# Patient Record
Sex: Female | Born: 1990 | Race: White | Hispanic: No | Marital: Married | Smoking: Current some day smoker
Health system: Southern US, Community
[De-identification: ages and names within clinical notes are randomized; demographics above are authoritative.]

## PROBLEM LIST (undated history)

## (undated) DIAGNOSIS — K219 Gastro-esophageal reflux disease without esophagitis: Secondary | ICD-10-CM

## (undated) DIAGNOSIS — Q512 Other doubling of uterus, unspecified: Secondary | ICD-10-CM

## (undated) DIAGNOSIS — O09899 Supervision of other high risk pregnancies, unspecified trimester: Principal | ICD-10-CM

## (undated) DIAGNOSIS — T7840XA Allergy, unspecified, initial encounter: Secondary | ICD-10-CM

## (undated) DIAGNOSIS — Q5128 Other doubling of uterus, other specified: Secondary | ICD-10-CM

## (undated) DIAGNOSIS — H9190 Unspecified hearing loss, unspecified ear: Secondary | ICD-10-CM

## (undated) DIAGNOSIS — F419 Anxiety disorder, unspecified: Secondary | ICD-10-CM

## (undated) DIAGNOSIS — F32A Depression, unspecified: Secondary | ICD-10-CM

## (undated) DIAGNOSIS — F329 Major depressive disorder, single episode, unspecified: Secondary | ICD-10-CM

## (undated) DIAGNOSIS — J45909 Unspecified asthma, uncomplicated: Secondary | ICD-10-CM

## (undated) DIAGNOSIS — O34599 Maternal care for other abnormalities of gravid uterus, unspecified trimester: Secondary | ICD-10-CM

## (undated) DIAGNOSIS — O24419 Gestational diabetes mellitus in pregnancy, unspecified control: Secondary | ICD-10-CM

## (undated) HISTORY — DX: Maternal care for other abnormalities of gravid uterus, unspecified trimester: O34.599

## (undated) HISTORY — DX: Anxiety disorder, unspecified: F41.9

## (undated) HISTORY — DX: Depression, unspecified: F32.A

## (undated) HISTORY — DX: Allergy, unspecified, initial encounter: T78.40XA

## (undated) HISTORY — DX: Gestational diabetes mellitus in pregnancy, unspecified control: O24.419

## (undated) HISTORY — DX: Gastro-esophageal reflux disease without esophagitis: K21.9

## (undated) HISTORY — PX: WISDOM TOOTH EXTRACTION: SHX21

## (undated) HISTORY — DX: Unspecified asthma, uncomplicated: J45.909

## (undated) HISTORY — DX: Unspecified hearing loss, unspecified ear: H91.90

## (undated) HISTORY — DX: Major depressive disorder, single episode, unspecified: F32.9

## (undated) HISTORY — DX: Supervision of other high risk pregnancies, unspecified trimester: O09.899

## (undated) HISTORY — DX: Other and unspecified doubling of uterus: Q51.28

## (undated) HISTORY — DX: Other doubling of uterus, unspecified: Q51.20

---
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
## 2009-01-07 IMAGING — US US OB FOLLOW-UP
1 series · 14 of 18 positions shown · non-contrast
Comparison: none

OBSTETRICAL ULTRASOUND:
 This ultrasound exam was performed in the [HOSPITAL] Ultrasound Department.  The OB US report was generated in the AS system, and faxed to the ordering physician.  This report is also available in [REDACTED] PACS.

[Series 1: us ob follow-up · 0.28mm/px · 14 of 18 slices shown]
[im 1/18]
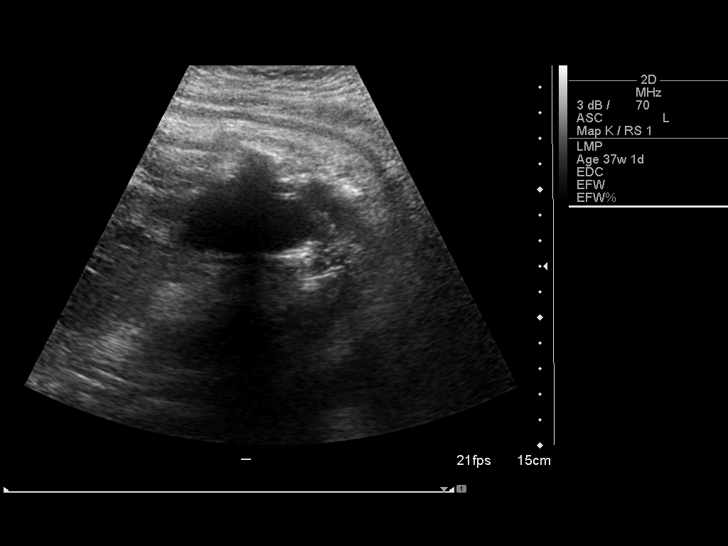
[im 2/18]
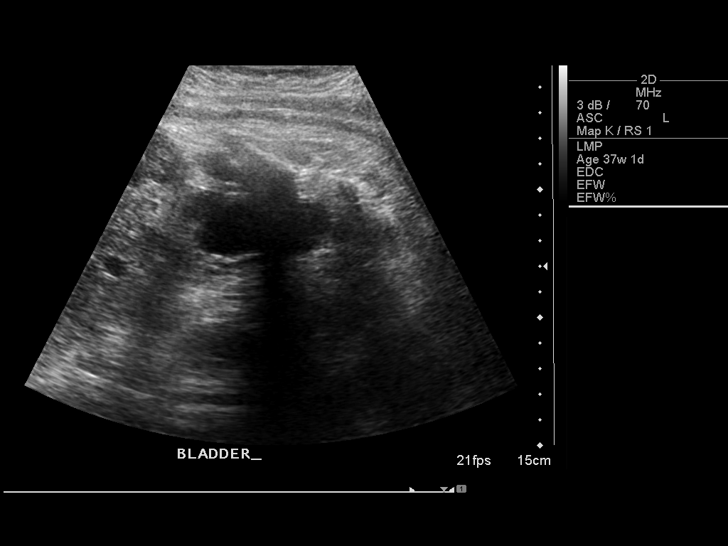
[im 4/18]
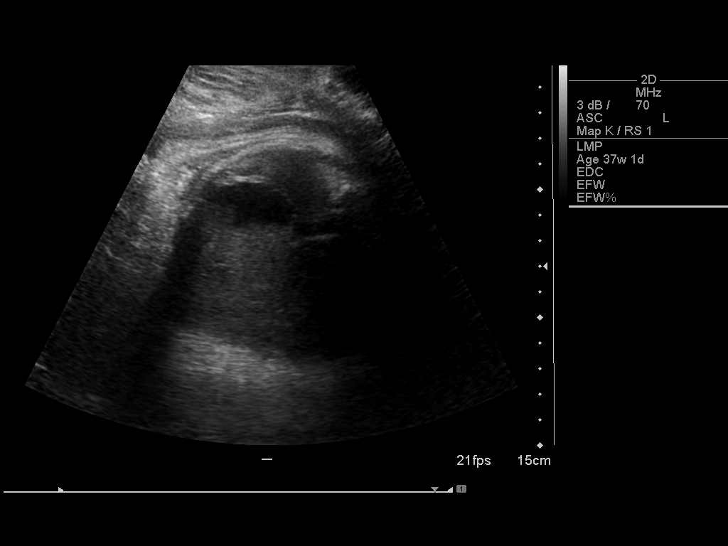
[im 5/18]
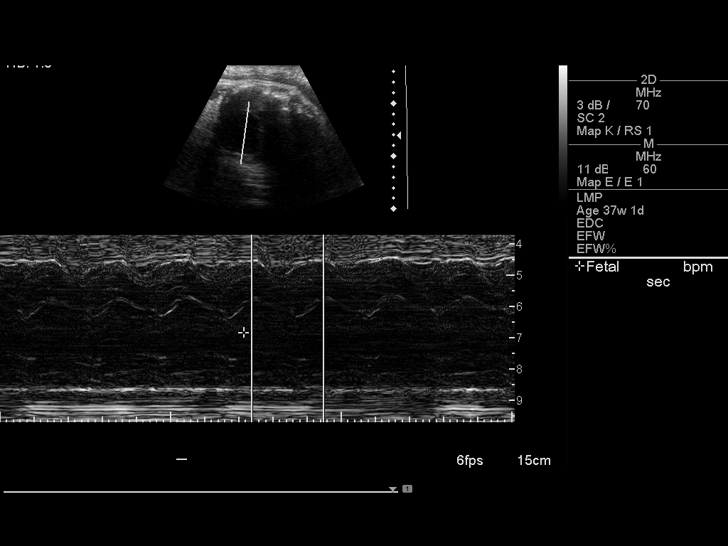
[im 6/18]
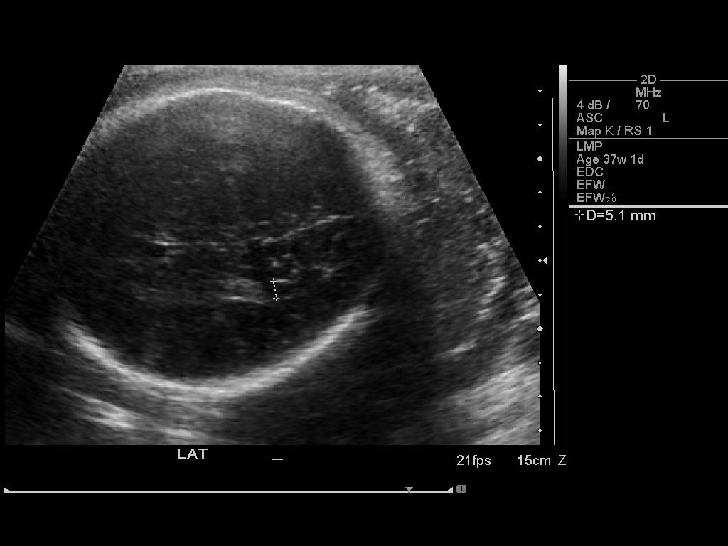
[im 8/18]
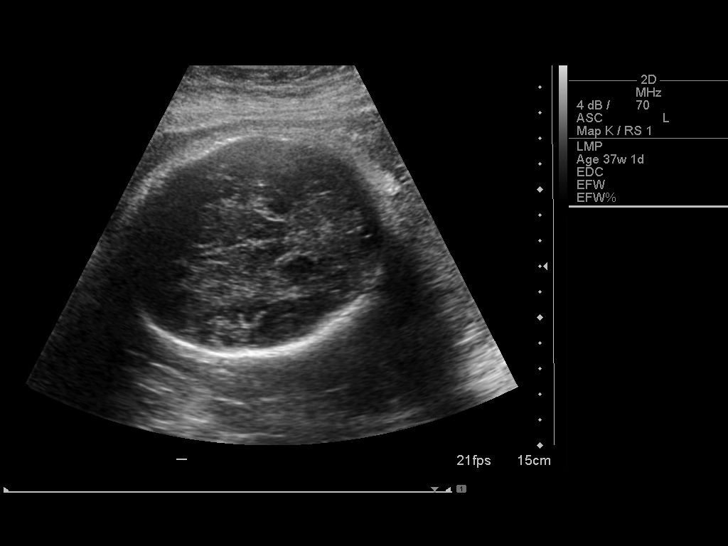
[im 9/18]
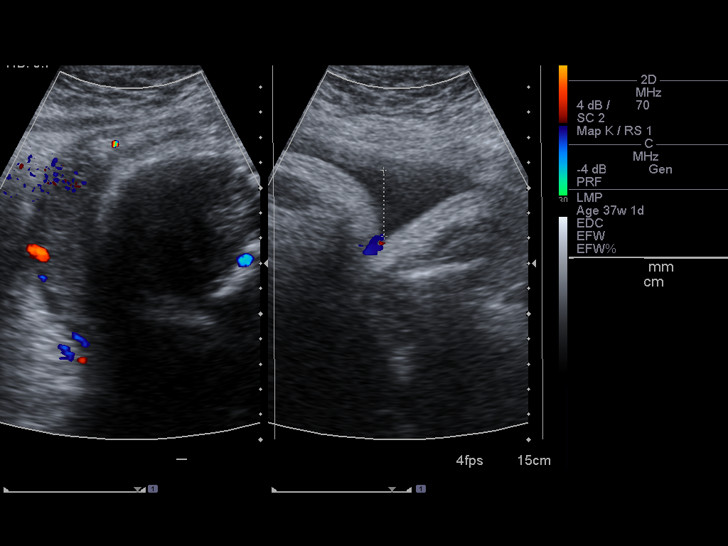
[im 10/18]
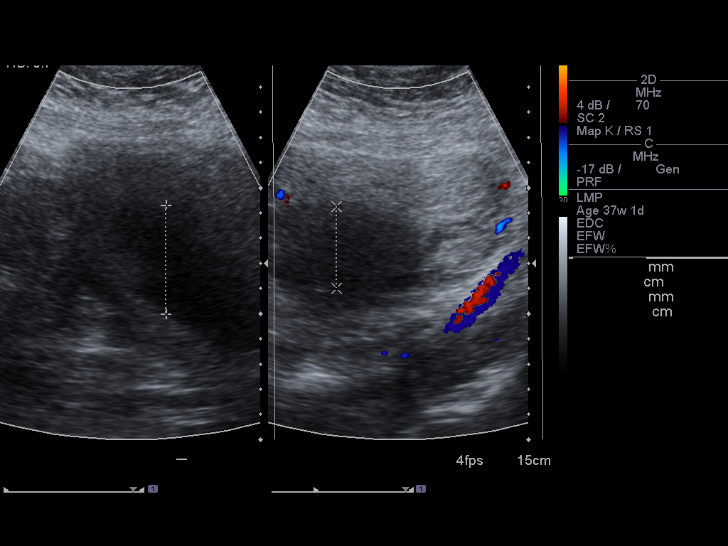
[im 11/18]
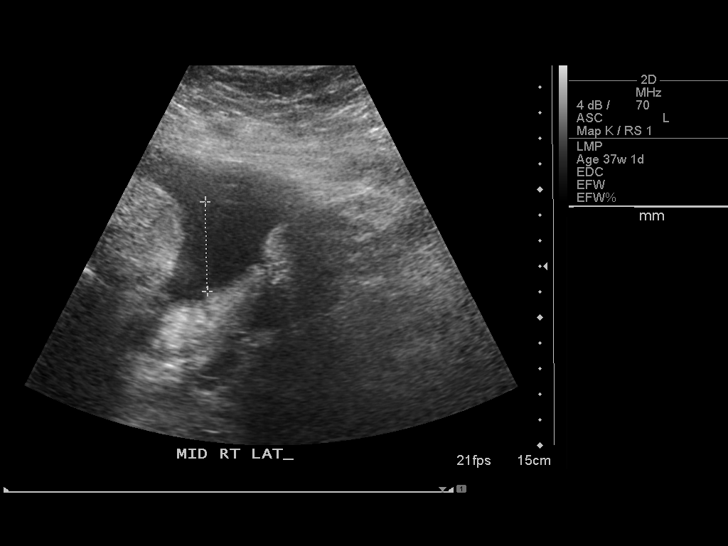
[im 13/18]
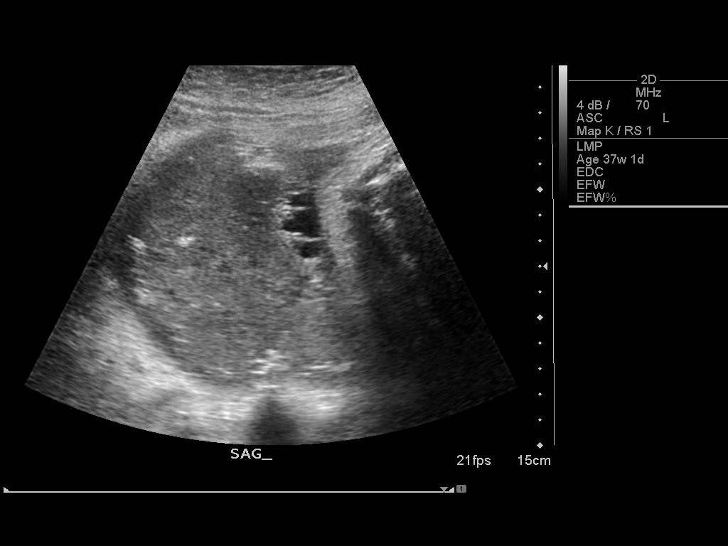
[im 14/18]
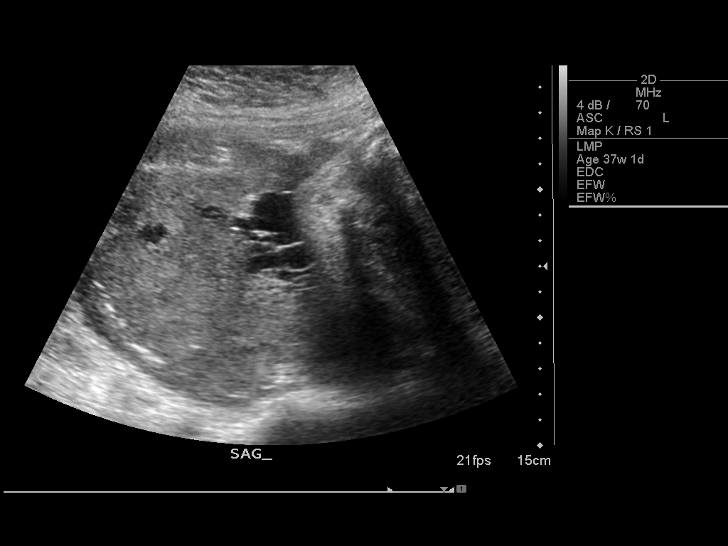
[im 15/18]
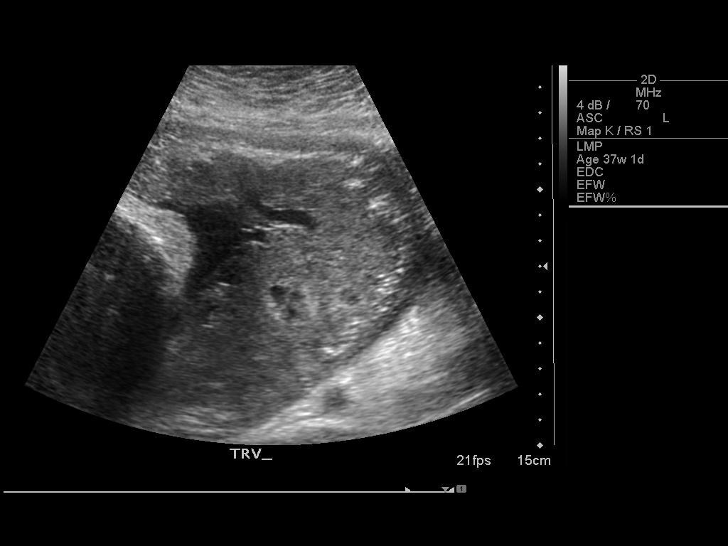
[im 17/18]
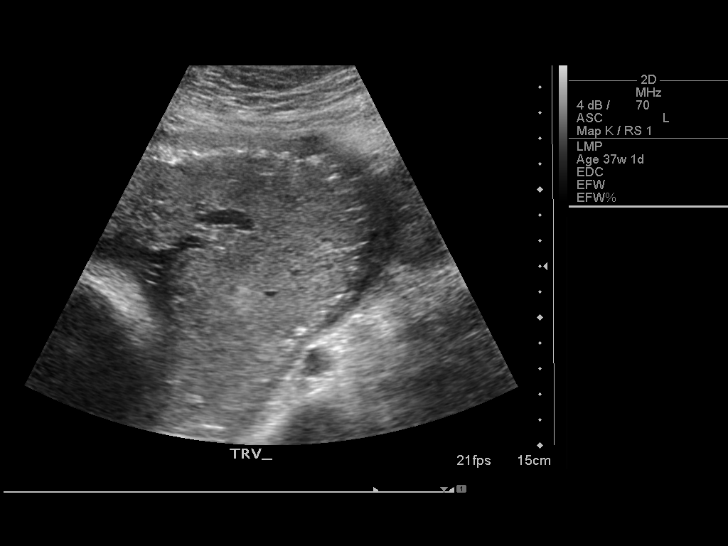
[im 18/18]
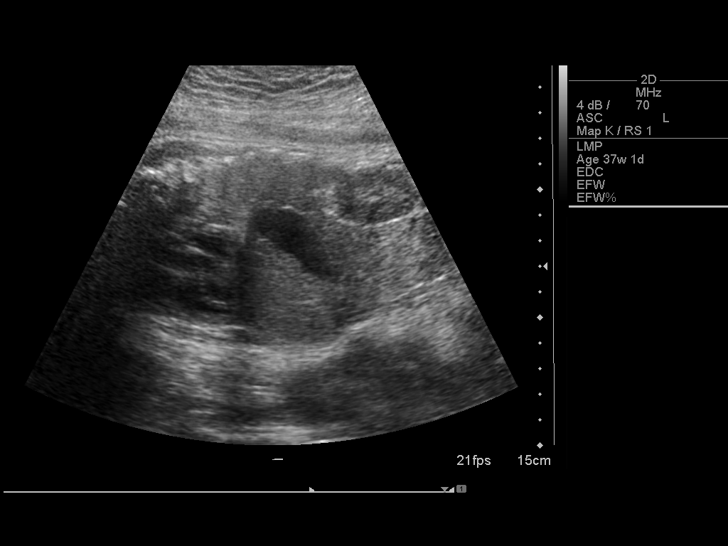

[14 of 18 positions shown; findings below may reference images not displayed]

IMPRESSION: See AS Obstetric US report.

## 2009-01-13 IMAGING — US US OB LIMITED
1 series · 10 of 10 positions shown · non-contrast
Comparison: none

OBSTETRICAL ULTRASOUND:
 This ultrasound exam was performed in the [HOSPITAL] Ultrasound Department.  The OB US report was generated in the AS system, and faxed to the ordering physician.  This report is also available in [REDACTED] PACS.

[Series 1: us ob limited · 0.28mm/px · 10 of 10 slices shown]
[im 1/10]
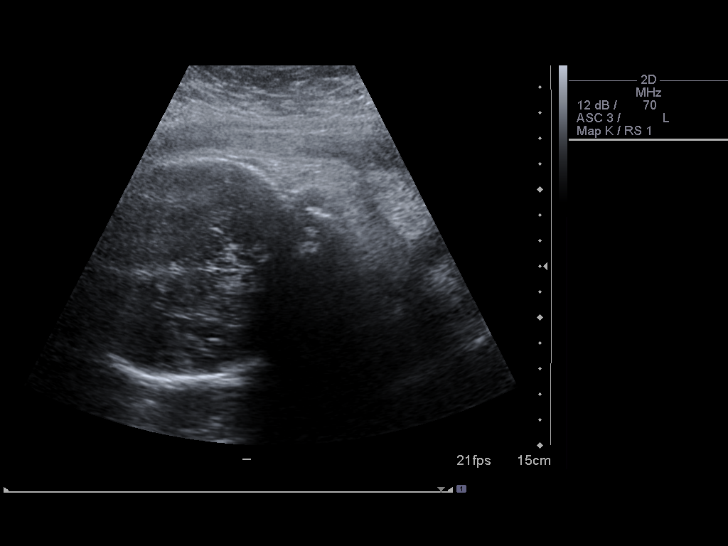
[im 2/10]
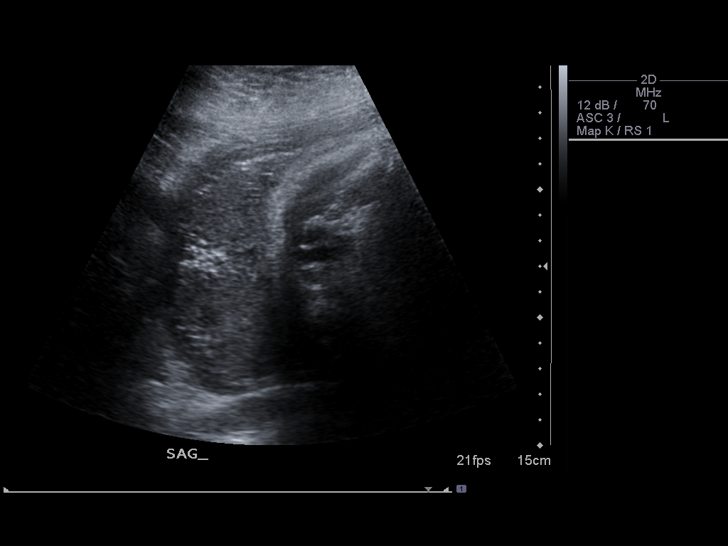
[im 3/10]
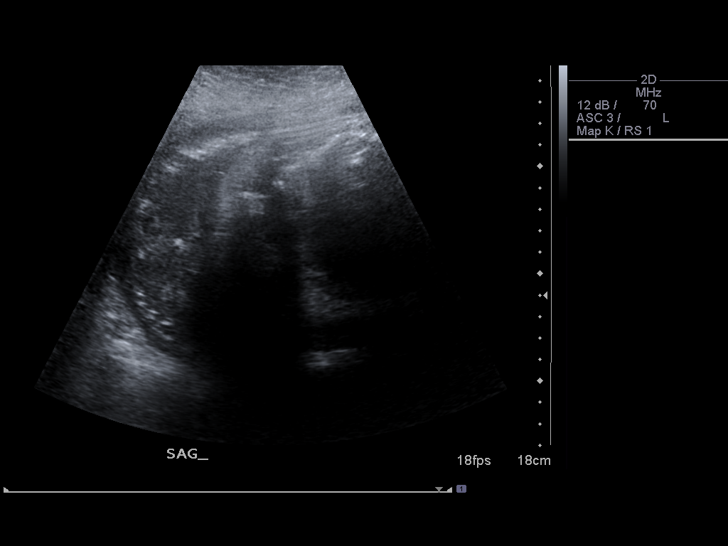
[im 4/10]
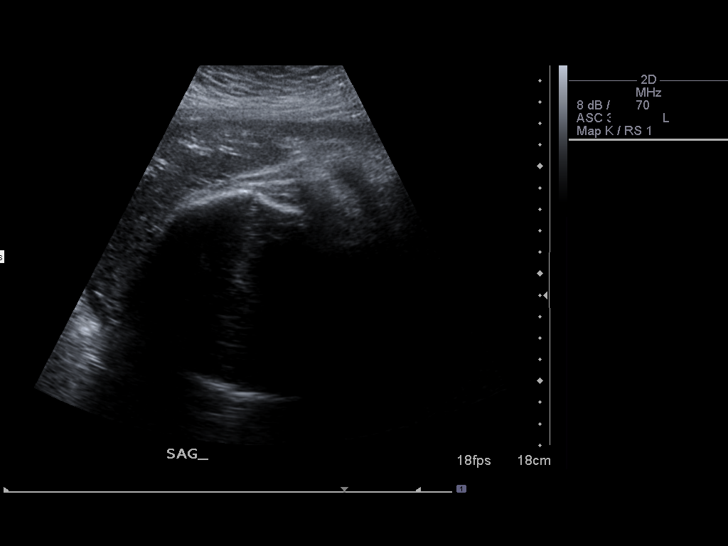
[im 5/10]
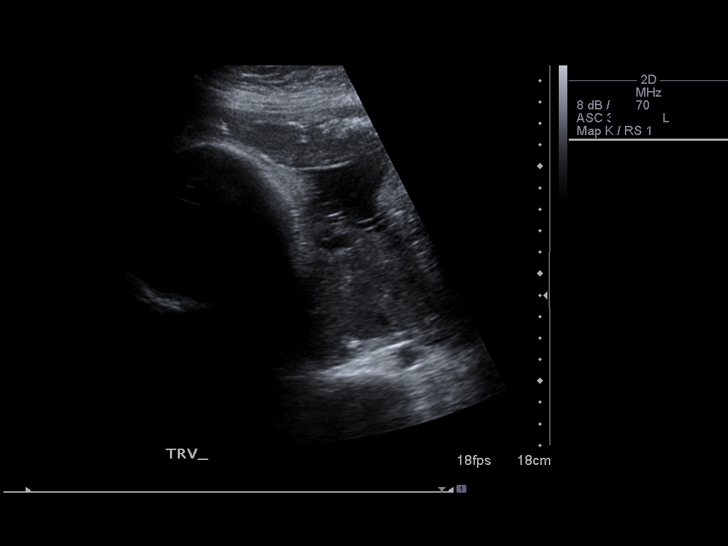
[im 6/10]
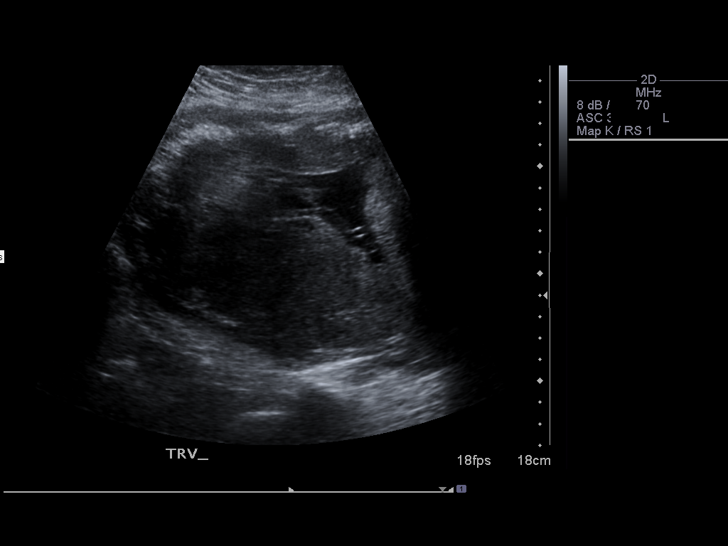
[im 7/10]
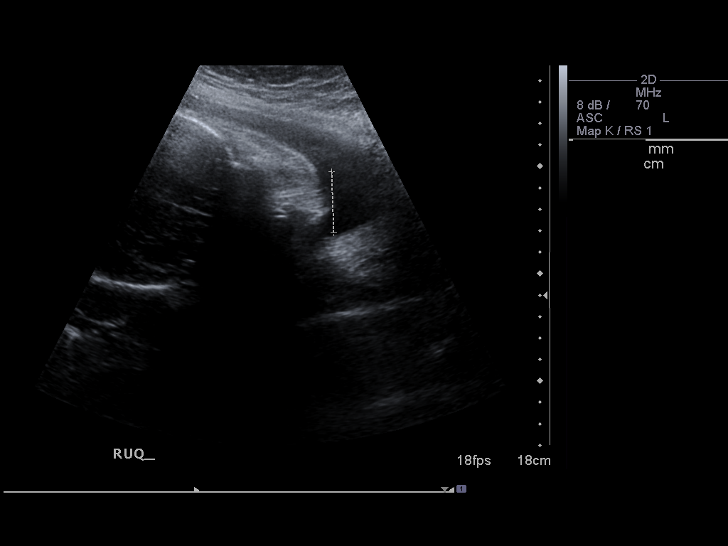
[im 8/10]
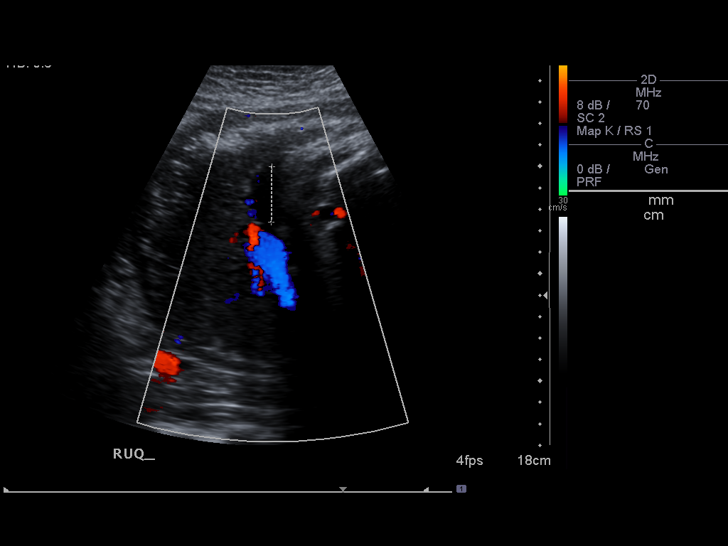
[im 9/10]
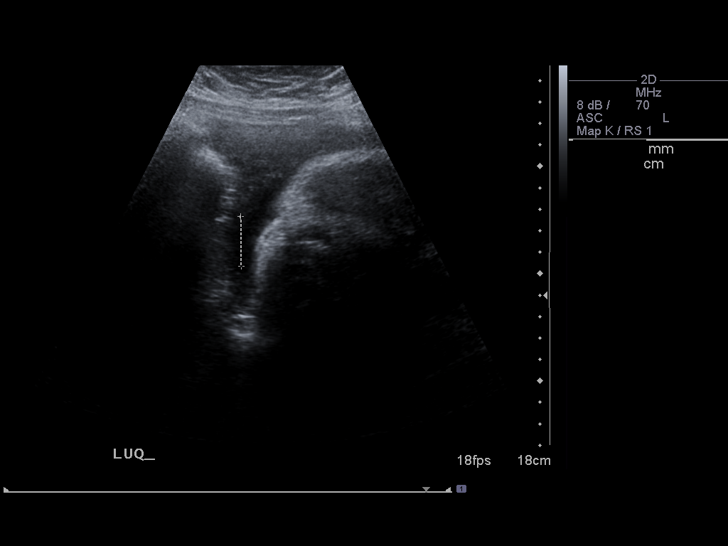
[im 10/10]
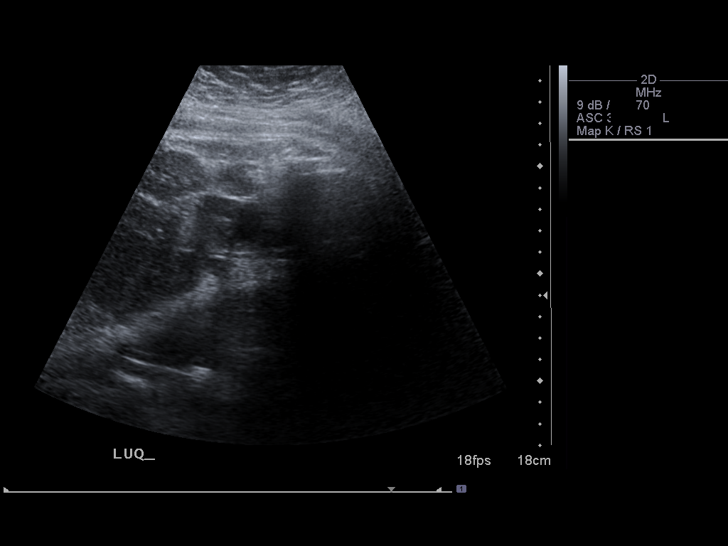

[10 of 10 positions shown; findings below may reference images not displayed]

IMPRESSION: See AS Obstetric US report.

## 2010-08-20 ENCOUNTER — Encounter: Payer: Self-pay | Admitting: *Deleted

## 2010-11-09 LAB — CBC
HCT: 36.7 % (ref 36.0–46.0)
Hemoglobin: 12.6 g/dL (ref 12.0–15.0)
MCHC: 34.3 g/dL (ref 30.0–36.0)
MCV: 80.8 fL (ref 78.0–100.0)
RDW: 15 % (ref 11.5–15.5)

## 2010-11-09 LAB — POCT I-STAT, CHEM 8
BUN: 11 mg/dL (ref 6–23)
Chloride: 108 mEq/L (ref 96–112)
HCT: 39 % (ref 36.0–46.0)
Hemoglobin: 13.3 g/dL (ref 12.0–15.0)
Potassium: 3.8 mEq/L (ref 3.5–5.1)
TCO2: 24 mmol/L (ref 0–100)

## 2010-12-12 NOTE — Discharge Summary (Signed)
Tracy Berger, Tracy Berger           ACCOUNT NO.:  [id number]   MEDICAL RECORD NO.:  [id number]          PATIENT TYPE:  INP   LOCATION:                                FACILITY:  [facility]   PHYSICIAN:  Phil D. Okey Dupre, M.D.     DATE OF BIRTH:  16-Aug-1990   DATE OF ADMISSION:  12/05/2007  DATE OF DISCHARGE:  12/08/2007                               DISCHARGE SUMMARY   REASON FOR ADMISSION:  Pregnancy at term.   PREOPERATIVE DIAGNOSES:  1. Breech presentation.  2. Term pregnancy.  3. Oligohydramnios.  4. Gestational diabetes.   PROCEDURE:  Low transverse cesarean section.   HOSPITAL COURSE:  Hospital course has been uneventful.  The patient has  progressed to a regular diet without complications.  Her vital signs  were stable.   PHYSICAL EXAMINATION:  VITAL SIGNS:  Stable.  HEART:  Regular rhythm and rate.  LUNGS:  Clear to auscultation bilaterally.  ABDOMEN:  Soft and nontender.  Bowel sounds x4.  Incision is intact.  There is no redness, swelling, or drainage.  Staples are intact.  Fundus  is firm.  Lochia is small amount.  EXTREMITIES:  There is no edema in the lower extremities.   LABORATORY DATA:  Hemoglobin on Dec 07, 2007, was 8.6 and hematocrit  25.0.  The patient is tolerating the anemic state well.  She is up and  ambulating without complications.   ASSESSMENT:  1. Stable postoperative day 3.  2. Anemia secondary to blood loss.   PLAN:  Discharged her home.   DISCHARGE MEDICATIONS:  1. Repliva 1 p.o. daily.  2. Motrin 600 1 p.o. q.6 h. p.r.n. cramps.  3. Lortab 5/325 mg 1 p.o. q.4 h. p.r.n. pain.   We are going to discharge her home.  I would have nurses follow up on  day 5 through 7 for removal of staples.      Zerita Boers, N.M.      Phil D. Okey Dupre, M.D.  Electronically Signed    DL/MEDQ  D:  04/54/0981  T:  12/08/2007  Job:  [id number]

## 2010-12-12 NOTE — Op Note (Signed)
NAMESHEYLIN, Tracy Berger           ACCOUNT NO.:  [id number]   MEDICAL RECORD NO.:  [id number]          PATIENT TYPE:  INP   LOCATION:                                FACILITY:  [facility]   PHYSICIAN:  Phil D. Okey Dupre, M.D.     DATE OF BIRTH:  1991/03/09   DATE OF PROCEDURE:  12/05/2007  DATE OF DISCHARGE:                               OPERATIVE REPORT   PROCEDURES:  Low-transverse cesarean section.   PREOPERATIVE DIAGNOSES:  Term pregnancy breech presentation, a  primigravida.  The patient with oligohydramnios and gestational  diabetes.   SURGEON:  Javier Glazier. Okey Dupre, MD   ANESTHESIA:  Spinal.   POSTOPERATIVE DIAGNOSES:  Term pregnancy breech presentation, a  primigravida.  The patient with oligohydramnios and gestational  diabetes.   PROCEDURE:  Low-transverse cesarean section.   ESTIMATED BLOOD LOSS:  700 mL.   SPECIMEN TO PATHOLOGY:  Placenta.   Infant female   PROCEDURE:  Under satisfactory spinal anesthesia with the patient in  dorsal supine position.  A Foley catheter in the urinary bladder, the  abdomen was prepped and draped in usual sterile manner and entered  through a transverse Pfannenstiel incision situated 3 cm above the  symphysis pubis and extending for a total length of 16 cm.  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  The visceral  peritoneum was opened transversely by sharp dissection and the bladder  pushed away from the lower uterine segment.  The lower uterine segment  was entered by sharp and blunt dissection, with a right single footling  breech presentation, and the baby was delivered by complete breech  extraction.  The uterus explored till the placenta was removed and the  specimens taken for an analysis and uterus was closed with continuous  running locked 0 Vicryl on an atraumatic needle.  Two  figure-of-eights  were placed in mid portion of the incision where there was still some  oozing using the same suture.  The area was observed for bleeding, none  was noted and then the area was irrigated, the ovaries and tubes were  both normal, and the fascia was closed with a continuous running 0  Vicryl on an atraumatic needle.  Subcutaneous bleeders were controlled with a cautery and skin edges were  approximated with skin staple, dry sterile dressings applied, and the  patient was transferred to the recovery room in satisfactory condition  having tolerated the procedure well with a total blood loss about 700  mL.      Phil D. Okey Dupre, M.D.  Electronically Signed     PDR/MEDQ  D:  12/05/2007  T:  12/06/2007  Job:  [id number]

## 2011-04-10 ENCOUNTER — Emergency Department (HOSPITAL_COMMUNITY)
Admission: EM | Admit: 2011-04-10 | Discharge: 2011-04-10 | Disposition: A | Discharge status: Home / Self Care | Payer: Self-pay | Attending: Emergency Medicine | Admitting: Emergency Medicine

## 2011-04-10 DIAGNOSIS — L989 Disorder of the skin and subcutaneous tissue, unspecified: Secondary | ICD-10-CM | POA: Insufficient documentation

## 2011-04-10 DIAGNOSIS — R21 Rash and other nonspecific skin eruption: Secondary | ICD-10-CM | POA: Insufficient documentation

## 2011-04-10 LAB — POCT I-STAT, CHEM 8
HCT: 34 % — ABNORMAL LOW (ref 36.0–46.0)
Hemoglobin: 11.6 g/dL — ABNORMAL LOW (ref 12.0–15.0)

## 2011-04-10 LAB — CBC
Hemoglobin: 11.2 g/dL — ABNORMAL LOW (ref 12.0–15.0)
MCHC: 33.5 g/dL (ref 30.0–36.0)
MCV: 88.8 fL (ref 78.0–100.0)
Platelets: 279 10*3/uL (ref 150–400)
WBC: 9.6 10*3/uL (ref 4.0–10.5)

## 2011-04-10 LAB — DIFFERENTIAL
Basophils Relative: 0 % (ref 0–1)
Eosinophils Absolute: 0.3 10*3/uL (ref 0.0–0.7)
Lymphocytes Relative: 14 % (ref 12–46)
Lymphs Abs: 1.3 10*3/uL (ref 0.7–4.0)
Monocytes Relative: 5 % (ref 3–12)

## 2011-04-19 LAB — POCT URINALYSIS DIP (DEVICE)
Hgb urine dipstick: NEGATIVE
Ketones, ur: NEGATIVE
Ketones, ur: NEGATIVE
Operator id: 297281
Operator id: 148111
Protein, ur: NEGATIVE
Protein, ur: 30 — AB
Specific Gravity, Urine: 1.025
Specific Gravity, Urine: 1.025
Urobilinogen, UA: 0.2
Urobilinogen, UA: 0.2
pH: 6
pH: 5.5

## 2011-04-20 LAB — POCT URINALYSIS DIP (DEVICE)
Hgb urine dipstick: NEGATIVE
Ketones, ur: NEGATIVE
Ketones, ur: NEGATIVE
Operator id: 297281
Operator id: 297281
Protein, ur: NEGATIVE
Protein, ur: NEGATIVE
Specific Gravity, Urine: 1.025
Specific Gravity, Urine: 1.01
Urobilinogen, UA: 0.2

## 2011-04-23 LAB — POCT URINALYSIS DIP (DEVICE)
Bilirubin Urine: NEGATIVE
Glucose, UA: NEGATIVE
Glucose, UA: NEGATIVE
Glucose, UA: NEGATIVE
Hgb urine dipstick: NEGATIVE
Ketones, ur: NEGATIVE
Ketones, ur: NEGATIVE
Ketones, ur: NEGATIVE
Operator id: 15968
Operator id: 159681
Operator id: 297281
Protein, ur: NEGATIVE
Specific Gravity, Urine: 1.02
Specific Gravity, Urine: 1.015
Specific Gravity, Urine: 1.025

## 2011-04-24 LAB — URINALYSIS, ROUTINE W REFLEX MICROSCOPIC
Glucose, UA: NEGATIVE
Protein, ur: NEGATIVE
Urobilinogen, UA: 0.2

## 2011-04-24 LAB — POCT URINALYSIS DIP (DEVICE)
Bilirubin Urine: NEGATIVE
Bilirubin Urine: NEGATIVE
Bilirubin Urine: NEGATIVE
Glucose, UA: NEGATIVE
Glucose, UA: NEGATIVE
Glucose, UA: NEGATIVE
Glucose, UA: NEGATIVE
Ketones, ur: NEGATIVE
Ketones, ur: NEGATIVE
Nitrite: NEGATIVE
Nitrite: NEGATIVE
Operator id: 194561
Operator id: 194561
Operator id: 200901
Operator id: 194561
Operator id: 297281
Protein, ur: 30 — AB
Specific Gravity, Urine: 1.02
Specific Gravity, Urine: 1.025
Specific Gravity, Urine: 1.025
Specific Gravity, Urine: 1.02
Urobilinogen, UA: 0.2
Urobilinogen, UA: 0.2
Urobilinogen, UA: 0.2
Urobilinogen, UA: 0.2
Urobilinogen, UA: 0.2

## 2011-05-04 LAB — POCT URINALYSIS DIP (DEVICE)
Operator id: 297281
Protein, ur: NEGATIVE
Urobilinogen, UA: 0.2

## 2011-05-07 LAB — POCT URINALYSIS DIP (DEVICE)
Operator id: 297281
Protein, ur: NEGATIVE
Specific Gravity, Urine: 1.02
Urobilinogen, UA: 0.2

## 2011-05-08 LAB — POCT URINALYSIS DIP (DEVICE)
Operator id: 159681
Protein, ur: NEGATIVE
Specific Gravity, Urine: >=1.03
Urobilinogen, UA: 0.2
pH: 5.5

## 2013-09-16 ENCOUNTER — Encounter: Payer: Self-pay | Admitting: Gastroenterology

## 2013-12-23 ENCOUNTER — Other Ambulatory Visit: Payer: Self-pay | Admitting: Obstetrics & Gynecology

## 2013-12-23 DIAGNOSIS — O3680X0 Pregnancy with inconclusive fetal viability, not applicable or unspecified: Secondary | ICD-10-CM

## 2013-12-24 ENCOUNTER — Ambulatory Visit: Payer: Medicaid Other

## 2013-12-25 ENCOUNTER — Encounter: Payer: Self-pay | Admitting: Obstetrics & Gynecology

## 2013-12-25 ENCOUNTER — Ambulatory Visit (INDEPENDENT_AMBULATORY_CARE_PROVIDER_SITE_OTHER): Payer: Medicaid Other

## 2013-12-25 ENCOUNTER — Other Ambulatory Visit: Payer: Self-pay | Admitting: Obstetrics & Gynecology

## 2013-12-25 DIAGNOSIS — Q5128 Other doubling of uterus, other specified: Secondary | ICD-10-CM

## 2013-12-25 DIAGNOSIS — O3680X0 Pregnancy with inconclusive fetal viability, not applicable or unspecified: Secondary | ICD-10-CM

## 2013-12-25 DIAGNOSIS — O34599 Maternal care for other abnormalities of gravid uterus, unspecified trimester: Secondary | ICD-10-CM

## 2013-12-25 DIAGNOSIS — O3400 Maternal care for unspecified congenital malformation of uterus, unspecified trimester: Secondary | ICD-10-CM

## 2013-12-25 NOTE — Progress Notes (Signed)
U/S(9+1wks)-single IUP noted in Rt uterus, +FCA noted, FHR-175 bpm, RT cx appears closed, CRL c/w LMP dates, LT uterus appears WNL, bilateral adnexa appears WNL unable to note # of ovaries in pelvis

## 2013-12-28 ENCOUNTER — Other Ambulatory Visit: Payer: Medicaid Other

## 2013-12-29 ENCOUNTER — Other Ambulatory Visit: Payer: Self-pay

## 2013-12-31 ENCOUNTER — Encounter: Payer: Medicaid Other | Admitting: Adult Health

## 2014-01-05 ENCOUNTER — Other Ambulatory Visit (HOSPITAL_COMMUNITY)
Admission: RE | Admit: 2014-01-05 | Discharge: 2014-01-05 | Disposition: A | Discharge status: Home / Self Care | Payer: Medicaid Other | Source: Ambulatory Visit | Attending: Adult Health | Admitting: Adult Health

## 2014-01-05 ENCOUNTER — Other Ambulatory Visit (HOSPITAL_COMMUNITY)
Admission: RE | Admit: 2014-01-05 | Discharge: 2014-01-05 | Disposition: A | Discharge status: Home / Self Care | Payer: Medicaid Other | Source: Ambulatory Visit | Attending: Obstetrics and Gynecology | Admitting: Obstetrics and Gynecology

## 2014-01-05 ENCOUNTER — Ambulatory Visit (INDEPENDENT_AMBULATORY_CARE_PROVIDER_SITE_OTHER): Payer: Medicaid Other | Admitting: Adult Health

## 2014-01-05 ENCOUNTER — Encounter: Payer: Self-pay | Admitting: Adult Health

## 2014-01-05 VITALS — BP 112/60 | Wt 204.0 lb

## 2014-01-05 DIAGNOSIS — Z1389 Encounter for screening for other disorder: Secondary | ICD-10-CM

## 2014-01-05 DIAGNOSIS — Z113 Encounter for screening for infections with a predominantly sexual mode of transmission: Secondary | ICD-10-CM | POA: Insufficient documentation

## 2014-01-05 DIAGNOSIS — Z01419 Encounter for gynecological examination (general) (routine) without abnormal findings: Secondary | ICD-10-CM | POA: Insufficient documentation

## 2014-01-05 DIAGNOSIS — H9190 Unspecified hearing loss, unspecified ear: Secondary | ICD-10-CM

## 2014-01-05 DIAGNOSIS — Z3480 Encounter for supervision of other normal pregnancy, unspecified trimester: Secondary | ICD-10-CM

## 2014-01-05 DIAGNOSIS — O09899 Supervision of other high risk pregnancies, unspecified trimester: Secondary | ICD-10-CM

## 2014-01-05 DIAGNOSIS — O3400 Maternal care for unspecified congenital malformation of uterus, unspecified trimester: Secondary | ICD-10-CM

## 2014-01-05 DIAGNOSIS — Q5128 Other doubling of uterus, other specified: Secondary | ICD-10-CM

## 2014-01-05 DIAGNOSIS — O34599 Maternal care for other abnormalities of gravid uterus, unspecified trimester: Secondary | ICD-10-CM

## 2014-01-05 DIAGNOSIS — Z331 Pregnant state, incidental: Secondary | ICD-10-CM

## 2014-01-05 HISTORY — DX: Unspecified hearing loss, unspecified ear: H91.90

## 2014-01-05 HISTORY — DX: Maternal care for other abnormalities of gravid uterus, unspecified trimester: O34.599

## 2014-01-05 HISTORY — DX: Supervision of other high risk pregnancies, unspecified trimester: O09.899

## 2014-01-05 LAB — POCT URINALYSIS DIPSTICK
Glucose, UA: NEGATIVE
Ketones, UA: NEGATIVE
Leukocytes, UA: NEGATIVE
NITRITE UA: NEGATIVE
Protein, UA: NEGATIVE
RBC UA: NEGATIVE

## 2014-01-05 LAB — CBC
HEMATOCRIT: 30.3 % — AB (ref 36.0–46.0)
Hemoglobin: 10.5 g/dL — ABNORMAL LOW (ref 12.0–15.0)
MCH: 30.1 pg (ref 26.0–34.0)
MCHC: 34.7 g/dL (ref 30.0–36.0)
MCV: 86.8 fL (ref 78.0–100.0)
PLATELETS: 242 10*3/uL (ref 150–400)
RBC: 3.49 MIL/uL — ABNORMAL LOW (ref 3.87–5.11)
RDW: 13.4 % (ref 11.5–15.5)
WBC: 10.3 10*3/uL (ref 4.0–10.5)

## 2014-01-05 MED ORDER — PRENATAL PLUS 27-1 MG PO TABS: 1.0000 | ORAL_TABLET | Freq: Every day | ORAL | Status: DC

## 2014-01-05 NOTE — Patient Instructions (Signed)

## 2014-01-05 NOTE — Progress Notes (Signed)
Pt here today for her New OB visit. Pt given CCNC form and lab consents to read over and sign. Pt states that she has 2 separate cervix and 2 separate uterus. Pt states that her gums are really swollen and bleed if you barely touch them.

## 2014-01-05 NOTE — Progress Notes (Signed)
Subjective:  Tracy Berger is a 23 y.o. G68P1001 Caucasian female at [redacted]w[redacted]d by LMP being seen today for her first obstetrical visit.  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  Pregnancy history fully reviewed.  Patient reports no complaints. Denies vb, cramping, uti s/s, abnormal/malodorous vag d/c, or vulvovaginal itching/irritation.  BP 112/60  Wt 204 lb (92.534 kg)  LMP 10/22/2013  HISTORY: OB History  Gravida Para Term Preterm AB SAB TAB Ectopic Multiple Living  2 1 1       1     # Outcome Date GA Lbr Len/2nd Weight Sex Delivery Anes PTL Lv  2 CUR           1 TRM 12/05/07 [redacted]w[redacted]d  7 lb 10 oz (3.459 kg) M LTCS   Y     Comments: breech, Pt has 2 uterus      Past Medical History  Diagnosis Date  . Gestational diabetes     with 1st pregnancy  . Didelphic uterus   . HOH (hard of hearing) 01/05/2014  . Supervision of other high-risk pregnancy(V23.89) 01/05/2014  . 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 Cesarean section    . Wisdom tooth extraction     Family History  Problem Relation Age of Onset  . Cancer Mother 47    colon  . Hearing loss Mother   . Hypertension Father   . Diabetes Father   . Birth defects Maternal Uncle     hole in his heart at birth  . Hearing loss Maternal Uncle   . Diabetes Paternal Aunt   . Diabetes Paternal Uncle   . Heart disease Maternal Grandmother   . Hearing loss Maternal Grandmother   . Diabetes Maternal Grandfather   . Diabetes Paternal Grandmother     Exam   System:     General: Well developed & nourished, no acute distress   Skin: Warm & dry, normal coloration and turgor, no rashes   Neurologic: Alert & oriented, normal mood   Cardiovascular: Regular rate & rhythm   Respiratory: Effort & rate normal, LCTAB, acyanotic   Abdomen: Soft, non tender   Extremities: normal strength, tone                                                           Normal thyroid, Pelvic Exam:    Perineum: Normal perineum   Vulva: Normal, no lesions   Vagina:  Normal mucosa, normal discharge, has septum   Cervix: Normal, , appears closed, has 2   Uterus: Normal size/shape/contour for GA on right has didelphys uterus   Thin prep pap smear done x 2 FHR:166 via doppler   Assessment:   Pregnancy: G2P1001 Patient Active Problem List   Diagnosis Date Noted  . HOH (hard of hearing) 01/05/2014  . Supervision of other high-risk pregnancy(V23.89) 01/05/2014  . 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 GERD 11/24/2008  . GI BLEEDING 11/24/2008    [redacted]w[redacted]d G2P1001 New OB visit     Plan:  Initial labs drawn Continue prenatal vitamins Problem list reviewed and updated Reviewed n/v relief measures and warning s/s to report  Reviewed recommended weight gain based on pre-gravid BMI Encouraged well-balanced diet Genetic Screening discussed Integrated Screen: requested Cystic fibrosis screening discussed requested Ultrasound discussed; fetal survey: requested Follow up in 2 weeks for IT/NT and see Dr Elonda Husky  Estill Dooms, NP 01/05/2014 3:33 PM

## 2014-01-06 LAB — DRUG SCREEN, URINE, NO CONFIRMATION
Amphetamine Screen, Ur: NEGATIVE
BARBITURATE QUANT UR: NEGATIVE
Benzodiazepines.: NEGATIVE
CREATININE, U: 142.4 mg/dL
Cocaine Metabolites: NEGATIVE
METHADONE: NEGATIVE
Marijuana Metabolite: NEGATIVE
Opiate Screen, Urine: NEGATIVE
PROPOXYPHENE: NEGATIVE
Phencyclidine (PCP): NEGATIVE

## 2014-01-06 LAB — URINALYSIS
BILIRUBIN URINE: NEGATIVE
GLUCOSE, UA: NEGATIVE mg/dL
HGB URINE DIPSTICK: NEGATIVE
Leukocytes, UA: NEGATIVE
Nitrite: NEGATIVE
PROTEIN: NEGATIVE mg/dL
Specific Gravity, Urine: 1.022 (ref 1.005–1.030)
Urobilinogen, UA: 0.2 mg/dL (ref 0.0–1.0)
pH: 6 (ref 5.0–8.0)

## 2014-01-06 LAB — HEPATITIS B SURFACE ANTIGEN: HEP B S AG: NEGATIVE

## 2014-01-06 LAB — TSH: TSH: 0.359 u[IU]/mL (ref 0.350–4.500)

## 2014-01-06 LAB — RUBELLA SCREEN: RUBELLA: 3.92 {index} — AB (ref <0.90)

## 2014-01-06 LAB — VARICELLA ZOSTER ANTIBODY, IGG: Varicella IgG: 352.1 Index — ABNORMAL HIGH (ref <135.00)

## 2014-01-06 LAB — HIV ANTIBODY (ROUTINE TESTING W REFLEX): HIV: NONREACTIVE

## 2014-01-06 LAB — RPR

## 2014-01-06 LAB — OXYCODONE SCREEN, UA, RFLX CONFIRM: OXYCODONE SCRN UR: NEGATIVE ng/mL

## 2014-01-07 LAB — URINE CULTURE: Colony Count: 30000

## 2014-01-07 LAB — CYSTIC FIBROSIS DIAGNOSTIC STUDY

## 2014-01-07 LAB — ABO AND RH: Rh Type: POSITIVE

## 2014-01-07 LAB — CYTOLOGY - PAP

## 2014-01-07 LAB — ANTIBODY SCREEN: ANTIBODY SCREEN: NEGATIVE

## 2014-01-08 ENCOUNTER — Encounter: Payer: Self-pay | Admitting: Adult Health

## 2014-01-08 LAB — CYTOLOGY - PAP

## 2014-01-13 ENCOUNTER — Other Ambulatory Visit: Payer: Self-pay | Admitting: Adult Health

## 2014-01-13 DIAGNOSIS — Z36 Encounter for antenatal screening of mother: Secondary | ICD-10-CM

## 2014-01-19 ENCOUNTER — Other Ambulatory Visit: Payer: Medicaid Other

## 2014-01-19 ENCOUNTER — Encounter: Payer: Medicaid Other | Admitting: Obstetrics & Gynecology

## 2014-01-20 ENCOUNTER — Encounter: Payer: Self-pay | Admitting: Obstetrics and Gynecology

## 2014-01-20 ENCOUNTER — Ambulatory Visit (INDEPENDENT_AMBULATORY_CARE_PROVIDER_SITE_OTHER): Payer: Medicaid Other

## 2014-01-20 ENCOUNTER — Ambulatory Visit (INDEPENDENT_AMBULATORY_CARE_PROVIDER_SITE_OTHER): Payer: Medicaid Other | Admitting: Obstetrics and Gynecology

## 2014-01-20 VITALS — BP 120/72 | Wt 204.0 lb

## 2014-01-20 DIAGNOSIS — O34219 Maternal care for unspecified type scar from previous cesarean delivery: Secondary | ICD-10-CM

## 2014-01-20 DIAGNOSIS — O09899 Supervision of other high risk pregnancies, unspecified trimester: Secondary | ICD-10-CM

## 2014-01-20 DIAGNOSIS — Q5128 Other doubling of uterus, other specified: Secondary | ICD-10-CM

## 2014-01-20 DIAGNOSIS — Z331 Pregnant state, incidental: Secondary | ICD-10-CM

## 2014-01-20 DIAGNOSIS — Z3481 Encounter for supervision of other normal pregnancy, first trimester: Secondary | ICD-10-CM

## 2014-01-20 DIAGNOSIS — Z1389 Encounter for screening for other disorder: Secondary | ICD-10-CM

## 2014-01-20 DIAGNOSIS — Z36 Encounter for antenatal screening of mother: Secondary | ICD-10-CM

## 2014-01-20 DIAGNOSIS — Z3480 Encounter for supervision of other normal pregnancy, unspecified trimester: Secondary | ICD-10-CM

## 2014-01-20 DIAGNOSIS — O34591 Maternal care for other abnormalities of gravid uterus, first trimester: Secondary | ICD-10-CM

## 2014-01-20 DIAGNOSIS — O3400 Maternal care for unspecified congenital malformation of uterus, unspecified trimester: Secondary | ICD-10-CM

## 2014-01-20 LAB — POCT URINALYSIS DIPSTICK
Blood, UA: NEGATIVE
Glucose, UA: NEGATIVE
KETONES UA: NEGATIVE
LEUKOCYTES UA: NEGATIVE
Nitrite, UA: NEGATIVE
Protein, UA: NEGATIVE

## 2014-01-20 NOTE — Progress Notes (Signed)
U/S(12+6wks)-single active fetus in Rt uterus, fundal Gr 0 placenta, cx appears closed (3.6cm), bilateral adnexa appears WNL, NB present, NT-1.62mm,FHR-158 bpm, Lt uterus noted,

## 2014-01-20 NOTE — Progress Notes (Signed)
Pt denies any problems or concerns at this time.  

## 2014-01-20 NOTE — Progress Notes (Signed)
High Risk Pregnancy Diagnosis(es):   Didelphic Uterus   G2P1001 [redacted]w[redacted]d Estimated Date of Delivery: 07/29/14  Blood pressure 120/72, weight 204 lb (92.534 kg), last menstrual period 10/22/2013.  Urinalysis: Negative  HPI: Patient presents for routine prenatal visit. She has a history of 1 prior cesarean section delivered at [facility]. Desires Repeat Cesarean.  BP weight and urine results all reviewed and noted. Patient  denies any bleeding and no rupture of membranes symptoms or regular contractions.  Fundal Height:  n/a Fetal Heart rate:  156 Edema:  minimal  Patient is without complaints. All questions were answered.  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

## 2014-01-26 LAB — MATERNAL SCREEN, INTEGRATED #1

## 2014-02-01 ENCOUNTER — Encounter: Payer: Medicaid Other | Admitting: Obstetrics & Gynecology

## 2014-02-01 ENCOUNTER — Other Ambulatory Visit: Payer: Medicaid Other

## 2014-02-17 ENCOUNTER — Ambulatory Visit (INDEPENDENT_AMBULATORY_CARE_PROVIDER_SITE_OTHER): Payer: Medicaid Other | Admitting: Advanced Practice Midwife

## 2014-02-17 ENCOUNTER — Encounter: Payer: Self-pay | Admitting: Advanced Practice Midwife

## 2014-02-17 ENCOUNTER — Other Ambulatory Visit: Payer: Medicaid Other

## 2014-02-17 VITALS — BP 120/58 | Wt 211.0 lb

## 2014-02-17 DIAGNOSIS — O09899 Supervision of other high risk pregnancies, unspecified trimester: Secondary | ICD-10-CM

## 2014-02-17 DIAGNOSIS — Q5128 Other doubling of uterus, other specified: Secondary | ICD-10-CM

## 2014-02-17 DIAGNOSIS — O34592 Maternal care for other abnormalities of gravid uterus, second trimester: Secondary | ICD-10-CM

## 2014-02-17 DIAGNOSIS — O3400 Maternal care for unspecified congenital malformation of uterus, unspecified trimester: Secondary | ICD-10-CM

## 2014-02-17 DIAGNOSIS — Z331 Pregnant state, incidental: Secondary | ICD-10-CM

## 2014-02-17 DIAGNOSIS — Z1389 Encounter for screening for other disorder: Secondary | ICD-10-CM

## 2014-02-17 LAB — POCT URINALYSIS DIPSTICK
Blood, UA: NEGATIVE
GLUCOSE UA: NEGATIVE
Ketones, UA: NEGATIVE
NITRITE UA: NEGATIVE
Protein, UA: NEGATIVE

## 2014-02-17 NOTE — Progress Notes (Signed)
High Risk Pregnancy Diagnosis(es):   Didelphys uterus  G2P1001 [redacted]w[redacted]d Estimated Date of Delivery: 07/29/14  Last menstrual period 10/22/2013.  Urinalysis: Negative   HPI:  BP weight and urine results all reviewed and noted. Patient reports good fetal movement, denies any bleeding and no rupture of membranes symptoms or regular contractions.  Fundal Height:  16 Fetal Heart rate:  152 Edema:  none  Patient is without complaints except feet hurting d/t varicose veins. Relief measures discussed. All questions were answered.  Assessment:  [redacted]w[redacted]d,   Didelphys uterus  Medication(s) Plans:  none  Treatment Plan: 2nd IT today;  cx length with anatomy scan  Follow up in 3 weeks for OB appt, anatomy scan, OB appt

## 2014-02-24 LAB — MATERNAL SCREEN, INTEGRATED #2
AFP MoM: 0.85
AFP, Serum: 25.5 ng/mL
Age risk Down Syndrome: 1:1100 {titer}
CROWN RUMP LENGTH MAT SCREEN 2: 69.1 mm
Calculated Gestational Age: 17
ESTRIOL FREE MAT SCREEN: 0.96 ng/mL
ESTRIOL MOM MAT SCREEN: 1.02
HCG, MOM MAT SCREEN: 0.52
HCG, SERUM MAT SCREEN: 12.4 [IU]/mL
INHIBIN A DIMERIC MAT SCREEN: 136 pg/mL
Inhibin A MoM: 0.96
MSS Down Syndrome: <1:5000 {titer}
MSS Trisomy 18 Risk: <1:5000 {titer}
NT MoM: 1.09
Nuchal Translucency: 1.68 mm
Number of fetuses: 1
PAPP-A MoM: 1.87
PAPP-A: 946 ng/mL

## 2014-03-10 ENCOUNTER — Ambulatory Visit (INDEPENDENT_AMBULATORY_CARE_PROVIDER_SITE_OTHER): Payer: Medicaid Other | Admitting: Women's Health

## 2014-03-10 ENCOUNTER — Ambulatory Visit (INDEPENDENT_AMBULATORY_CARE_PROVIDER_SITE_OTHER): Payer: Medicaid Other

## 2014-03-10 ENCOUNTER — Encounter: Payer: Self-pay | Admitting: Women's Health

## 2014-03-10 ENCOUNTER — Other Ambulatory Visit: Payer: Self-pay | Admitting: Advanced Practice Midwife

## 2014-03-10 VITALS — BP 132/56 | Wt 212.0 lb

## 2014-03-10 DIAGNOSIS — O3400 Maternal care for unspecified congenital malformation of uterus, unspecified trimester: Secondary | ICD-10-CM

## 2014-03-10 DIAGNOSIS — Z1389 Encounter for screening for other disorder: Secondary | ICD-10-CM

## 2014-03-10 DIAGNOSIS — O34219 Maternal care for unspecified type scar from previous cesarean delivery: Secondary | ICD-10-CM

## 2014-03-10 DIAGNOSIS — O09899 Supervision of other high risk pregnancies, unspecified trimester: Secondary | ICD-10-CM

## 2014-03-10 DIAGNOSIS — Z331 Pregnant state, incidental: Secondary | ICD-10-CM

## 2014-03-10 DIAGNOSIS — Q5128 Other doubling of uterus, other specified: Secondary | ICD-10-CM

## 2014-03-10 DIAGNOSIS — O34592 Maternal care for other abnormalities of gravid uterus, second trimester: Secondary | ICD-10-CM

## 2014-03-10 DIAGNOSIS — Q512 Other doubling of uterus, unspecified: Secondary | ICD-10-CM

## 2014-03-10 LAB — POCT URINALYSIS DIPSTICK
GLUCOSE UA: NEGATIVE
Ketones, UA: NEGATIVE
Leukocytes, UA: NEGATIVE
NITRITE UA: NEGATIVE
RBC UA: NEGATIVE

## 2014-03-10 NOTE — Progress Notes (Signed)
U/S(19+6wks)-IUP within Rt uterus, active fetus, FHR-153 bpm, meas c/w dates, fluid WNL, posterior Gr 0 placenta, RT cx appears closed (3.5cm), bilateral adnexa appears WNL,no major abnl noted, Lt uterus appears WNL, Female fetus

## 2014-03-10 NOTE — Progress Notes (Signed)
High Risk Pregnancy Diagnosis(es): uterine didelphys G2P1001 [redacted]w[redacted]d Estimated Date of Delivery: 07/29/14 BP 132/56  Wt 212 lb (96.163 kg)  LMP 10/22/2013  Urinalysis: Positive for tr protein HPI:  Rt hip/leg pain- sounds like sciatica, can try exercises and chiropractor if needed BP, weight, and urine reviewed.  Reports good fm. Denies regular uc's, lof, vb, uti s/s. No complaints.  Fundal Height:  @ U Fetal Heart rate:  153 u/s Edema:  none  Reviewed today's normal anatomy u/s, ptl s/s, fm All questions were answered Assessment: [redacted]w[redacted]d uterine didelphys Medication(s) Plans:  n/a Treatment Plan:  Continue care Follow up in 4wks for high-risk OB appt

## 2014-03-10 NOTE — Patient Instructions (Signed)
[place] Pediatricians:  [facility] [phone]            [facility] [phone]                 [facility] [phone] (usually doesn't accept new patients unless you have family there already, you are always welcome to call and ask)             [facility] ([address] [place]) [phone]   [place] Pediatricians:   [facility]: [phone]  [facility]: [phone]   Second Trimester of Pregnancy The second trimester is from week 13 through week 28, months 4 through 6. The second trimester is often a time when you feel your best. Your body has also adjusted to being pregnant, and you begin to feel better physically. Usually, morning sickness has lessened or quit completely, you may have more energy, and you may have an increase in appetite. The second trimester is also a time when the fetus is growing rapidly. At the end of the sixth month, the fetus is about 9 inches long and weighs about 1 pounds. You will likely begin to feel the baby move (quickening) between 18 and 20 weeks of the pregnancy. BODY CHANGES Your body goes through many changes during pregnancy. The changes vary from woman to woman.   Your weight will continue to increase. You will notice your lower abdomen bulging out.  You may begin to get stretch marks on your hips, abdomen, and breasts.  You may develop headaches that can be relieved by medicines approved by your health care provider.  You may urinate more often because the fetus is pressing on your bladder.  You may develop or continue to have heartburn as a result of your pregnancy.  You may develop constipation because certain hormones are causing the muscles that push waste through your intestines to slow down.  You may develop hemorrhoids or swollen, bulging veins (varicose veins).  You may have back pain because of the weight gain and  pregnancy hormones relaxing your joints between the bones in your pelvis and as a result of a shift in weight and the muscles that support your balance.  Your breasts will continue to grow and be tender.  Your gums may bleed and may be sensitive to brushing and flossing.  Dark spots or blotches (chloasma, mask of pregnancy) may develop on your face. This will likely fade after the baby is born.  A dark line from your belly button to the pubic area (linea nigra) may appear. This will likely fade after the baby is born.  You may have changes in your hair. These can include thickening of your hair, rapid growth, and changes in texture. Some women also have hair loss during or after pregnancy, or hair that feels dry or thin. Your hair will most likely return to normal after your baby is born. WHAT TO EXPECT AT YOUR PRENATAL VISITS During a routine prenatal visit:  You will be weighed to make sure you and the fetus are growing normally.  Your blood pressure will be taken.  Your abdomen will be measured to track your baby's growth.  The fetal heartbeat will be listened to.  Any test results from the previous visit will be discussed. Your health care provider may ask you:  How you are feeling.  If you are feeling the baby move.  If you have had any abnormal symptoms, such as leaking fluid,   bleeding, severe headaches, or abdominal cramping.  If you have any questions. Other tests that may be performed during your second trimester include:  Blood tests that check for:  Low iron levels (anemia).  Gestational diabetes (between 24 and 28 weeks).  Rh antibodies.  Urine tests to check for infections, diabetes, or protein in the urine.  An ultrasound to confirm the proper growth and development of the baby.  An amniocentesis to check for possible genetic problems.  Fetal screens for spina bifida and Down syndrome. HOME CARE INSTRUCTIONS   Avoid all smoking, herbs, alcohol, and  unprescribed drugs. These chemicals affect the formation and growth of the baby.  Follow your health care provider's instructions regarding medicine use. There are medicines that are either safe or unsafe to take during pregnancy.  Exercise only as directed by your health care provider. Experiencing uterine cramps is a good sign to stop exercising.  Continue to eat regular, healthy meals.  Wear a good support bra for breast tenderness.  Do not use hot tubs, steam rooms, or saunas.  Wear your seat belt at all times when driving.  Avoid raw meat, uncooked cheese, cat litter boxes, and soil used by cats. These carry germs that can cause birth defects in the baby.  Take your prenatal vitamins.  Try taking a stool softener (if your health care provider approves) if you develop constipation. Eat more high-fiber foods, such as fresh vegetables or fruit and whole grains. Drink plenty of fluids to keep your urine clear or pale yellow.  Take warm sitz baths to soothe any pain or discomfort caused by hemorrhoids. Use hemorrhoid cream if your health care provider approves.  If you develop varicose veins, wear support hose. Elevate your feet for 15 minutes, 3-4 times a day. Limit salt in your diet.  Avoid heavy lifting, wear low heel shoes, and practice good posture.  Rest with your legs elevated if you have leg cramps or low back pain.  Visit your dentist if you have not gone yet during your pregnancy. Use a soft toothbrush to brush your teeth and be gentle when you floss.  A sexual relationship may be continued unless your health care provider directs you otherwise.  Continue to go to all your prenatal visits as directed by your health care provider. SEEK MEDICAL CARE IF:   You have dizziness.  You have mild pelvic cramps, pelvic pressure, or nagging pain in the abdominal area.  You have persistent nausea, vomiting, or diarrhea.  You have a bad smelling vaginal discharge.  You have  pain with urination. SEEK IMMEDIATE MEDICAL CARE IF:   You have a fever.  You are leaking fluid from your vagina.  You have spotting or bleeding from your vagina.  You have severe abdominal cramping or pain.  You have rapid weight gain or loss.  You have shortness of breath with chest pain.  You notice sudden or extreme swelling of your face, hands, ankles, feet, or legs.  You have not felt your baby move in over an hour.  You have severe headaches that do not go away with medicine.  You have vision changes. Document Released: 07/10/2001 Document Revised: 07/21/2013 Document Reviewed: 09/16/2012 Exit[employer] Patient Information 2015 [employer]. This information is not intended to replace advice given to you by your health care provider. Make sure you discuss any questions you have with your health care provider.  

## 2014-04-07 ENCOUNTER — Ambulatory Visit (INDEPENDENT_AMBULATORY_CARE_PROVIDER_SITE_OTHER): Payer: Medicaid Other | Admitting: Women's Health

## 2014-04-07 ENCOUNTER — Encounter: Payer: Self-pay | Admitting: Women's Health

## 2014-04-07 VITALS — BP 112/60 | Wt 218.0 lb

## 2014-04-07 DIAGNOSIS — O09899 Supervision of other high risk pregnancies, unspecified trimester: Secondary | ICD-10-CM

## 2014-04-07 DIAGNOSIS — Z331 Pregnant state, incidental: Secondary | ICD-10-CM

## 2014-04-07 DIAGNOSIS — O3400 Maternal care for unspecified congenital malformation of uterus, unspecified trimester: Secondary | ICD-10-CM

## 2014-04-07 DIAGNOSIS — Q5128 Other doubling of uterus, other specified: Secondary | ICD-10-CM

## 2014-04-07 DIAGNOSIS — O99019 Anemia complicating pregnancy, unspecified trimester: Secondary | ICD-10-CM

## 2014-04-07 DIAGNOSIS — O34219 Maternal care for unspecified type scar from previous cesarean delivery: Secondary | ICD-10-CM

## 2014-04-07 DIAGNOSIS — Z1389 Encounter for screening for other disorder: Secondary | ICD-10-CM

## 2014-04-07 DIAGNOSIS — R5383 Other fatigue: Secondary | ICD-10-CM

## 2014-04-07 DIAGNOSIS — Q512 Other doubling of uterus, unspecified: Secondary | ICD-10-CM

## 2014-04-07 LAB — POCT URINALYSIS DIPSTICK
Blood, UA: NEGATIVE
Glucose, UA: NEGATIVE
Ketones, UA: NEGATIVE
LEUKOCYTES UA: NEGATIVE
Nitrite, UA: NEGATIVE
PROTEIN UA: NEGATIVE

## 2014-04-07 LAB — POCT HEMOGLOBIN: HEMOGLOBIN: 11.3 g/dL — AB (ref 12.2–16.2)

## 2014-04-07 NOTE — Addendum Note (Signed)
Addended by: Traci Sermon A on: 04/07/2014 11:34 AM   Modules accepted: Orders

## 2014-04-07 NOTE — Addendum Note (Signed)
Addended by: Moreen Fowler R on: 04/07/2014 10:58 AM   Modules accepted: Orders

## 2014-04-07 NOTE — Progress Notes (Signed)
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 BP, weight, and urine reviewed.  Reports good fm. Denies regular uc's, lof, vb, uti s/s.  Fingerstick Hgb: 11.3  Fundal Height:  25 Fetal Heart rate:  153 Edema:  none  Reviewed ptl s/s, fm All questions were answered Assessment: [redacted]w[redacted]d uterine didelphus Medication(s) Plans:  n/a Treatment Plan:  Continue current management Follow up in 4wks for high-risk OB appt and pn2

## 2014-04-07 NOTE — Patient Instructions (Signed)
You will have your sugar test next visit.  Please do not eat or drink anything after midnight the night before you come, not even water.  You will be here for at least two hours.     Second Trimester of Pregnancy The second trimester is from week 13 through week 28, months 4 through 6. The second trimester is often a time when you feel your best. Your body has also adjusted to being pregnant, and you begin to feel better physically. Usually, morning sickness has lessened or quit completely, you may have more energy, and you may have an increase in appetite. The second trimester is also a time when the fetus is growing rapidly. At the end of the sixth month, the fetus is about 9 inches long and weighs about 1 pounds. You will likely begin to feel the baby move (quickening) between 18 and 20 weeks of the pregnancy. BODY CHANGES Your body goes through many changes during pregnancy. The changes vary from woman to woman.   Your weight will continue to increase. You will notice your lower abdomen bulging out.  You may begin to get stretch marks on your hips, abdomen, and breasts.  You may develop headaches that can be relieved by medicines approved by your health care provider.  You may urinate more often because the fetus is pressing on your bladder.  You may develop or continue to have heartburn as a result of your pregnancy.  You may develop constipation because certain hormones are causing the muscles that push waste through your intestines to slow down.  You may develop hemorrhoids or swollen, bulging veins (varicose veins).  You may have back pain because of the weight gain and pregnancy hormones relaxing your joints between the bones in your pelvis and as a result of a shift in weight and the muscles that support your balance.  Your breasts will continue to grow and be tender.  Your gums may bleed and may be sensitive to brushing and flossing.  Dark spots or blotches (chloasma, mask of  pregnancy) may develop on your face. This will likely fade after the baby is born.  A dark line from your belly button to the pubic area (linea nigra) may appear. This will likely fade after the baby is born.  You may have changes in your hair. These can include thickening of your hair, rapid growth, and changes in texture. Some women also have hair loss during or after pregnancy, or hair that feels dry or thin. Your hair will most likely return to normal after your baby is born. WHAT TO EXPECT AT YOUR PRENATAL VISITS During a routine prenatal visit:  You will be weighed to make sure you and the fetus are growing normally.  Your blood pressure will be taken.  Your abdomen will be measured to track your baby's growth.  The fetal heartbeat will be listened to.  Any test results from the previous visit will be discussed. Your health care provider may ask you:  How you are feeling.  If you are feeling the baby move.  If you have had any abnormal symptoms, such as leaking fluid, bleeding, severe headaches, or abdominal cramping.  If you have any questions. Other tests that may be performed during your second trimester include:  Blood tests that check for:  Low iron levels (anemia).  Gestational diabetes (between 24 and 28 weeks).  Rh antibodies.  Urine tests to check for infections, diabetes, or protein in the urine.  An ultrasound  to confirm the proper growth and development of the baby.  An amniocentesis to check for possible genetic problems.  Fetal screens for spina bifida and Down syndrome. HOME CARE INSTRUCTIONS   Avoid all smoking, herbs, alcohol, and unprescribed drugs. These chemicals affect the formation and growth of the baby.  Follow your health care provider's instructions regarding medicine use. There are medicines that are either safe or unsafe to take during pregnancy.  Exercise only as directed by your health care provider. Experiencing uterine cramps is a  good sign to stop exercising.  Continue to eat regular, healthy meals.  Wear a good support bra for breast tenderness.  Do not use hot tubs, steam rooms, or saunas.  Wear your seat belt at all times when driving.  Avoid raw meat, uncooked cheese, cat litter boxes, and soil used by cats. These carry germs that can cause birth defects in the baby.  Take your prenatal vitamins.  Try taking a stool softener (if your health care provider approves) if you develop constipation. Eat more high-fiber foods, such as fresh vegetables or fruit and whole grains. Drink plenty of fluids to keep your urine clear or pale yellow.  Take warm sitz baths to soothe any pain or discomfort caused by hemorrhoids. Use hemorrhoid cream if your health care provider approves.  If you develop varicose veins, wear support hose. Elevate your feet for 15 minutes, 3-4 times a day. Limit salt in your diet.  Avoid heavy lifting, wear low heel shoes, and practice good posture.  Rest with your legs elevated if you have leg cramps or low back pain.  Visit your dentist if you have not gone yet during your pregnancy. Use a soft toothbrush to brush your teeth and be gentle when you floss.  A sexual relationship may be continued unless your health care provider directs you otherwise.  Continue to go to all your prenatal visits as directed by your health care provider. SEEK MEDICAL CARE IF:   You have dizziness.  You have mild pelvic cramps, pelvic pressure, or nagging pain in the abdominal area.  You have persistent nausea, vomiting, or diarrhea.  You have a bad smelling vaginal discharge.  You have pain with urination. SEEK IMMEDIATE MEDICAL CARE IF:   You have a fever.  You are leaking fluid from your vagina.  You have spotting or bleeding from your vagina.  You have severe abdominal cramping or pain.  You have rapid weight gain or loss.  You have shortness of breath with chest pain.  You notice sudden  or extreme swelling of your face, hands, ankles, feet, or legs.  You have not felt your baby move in over an hour.  You have severe headaches that do not go away with medicine.  You have vision changes. Document Released: 07/10/2001 Document Revised: 07/21/2013 Document Reviewed: 09/16/2012 [facility] Patient Information 2015 [place], [place]. This information is not intended to replace advice given to you by your health care provider. Make sure you discuss any questions you have with your health care provider.

## 2014-04-20 ENCOUNTER — Encounter: Payer: Self-pay | Admitting: Gastroenterology

## 2014-05-05 ENCOUNTER — Encounter: Payer: Medicaid Other | Admitting: Obstetrics and Gynecology

## 2014-05-05 ENCOUNTER — Other Ambulatory Visit: Payer: Medicaid Other

## 2014-05-06 ENCOUNTER — Encounter: Payer: Medicaid Other | Admitting: Advanced Practice Midwife

## 2014-05-06 ENCOUNTER — Other Ambulatory Visit: Payer: Medicaid Other

## 2014-05-12 ENCOUNTER — Encounter: Payer: Medicaid Other | Admitting: Advanced Practice Midwife

## 2014-05-12 ENCOUNTER — Other Ambulatory Visit: Payer: Medicaid Other

## 2014-05-13 ENCOUNTER — Other Ambulatory Visit (INDEPENDENT_AMBULATORY_CARE_PROVIDER_SITE_OTHER): Payer: Medicaid Other

## 2014-05-13 ENCOUNTER — Other Ambulatory Visit: Payer: Medicaid Other

## 2014-05-13 ENCOUNTER — Ambulatory Visit (INDEPENDENT_AMBULATORY_CARE_PROVIDER_SITE_OTHER): Payer: Medicaid Other | Admitting: Advanced Practice Midwife

## 2014-05-13 VITALS — BP 108/52 | Wt 227.8 lb

## 2014-05-13 DIAGNOSIS — Z114 Encounter for screening for human immunodeficiency virus [HIV]: Secondary | ICD-10-CM

## 2014-05-13 DIAGNOSIS — Q512 Other doubling of uterus: Secondary | ICD-10-CM

## 2014-05-13 DIAGNOSIS — O24410 Gestational diabetes mellitus in pregnancy, diet controlled: Secondary | ICD-10-CM

## 2014-05-13 DIAGNOSIS — Q5128 Other doubling of uterus, other specified: Secondary | ICD-10-CM

## 2014-05-13 DIAGNOSIS — Z331 Pregnant state, incidental: Secondary | ICD-10-CM

## 2014-05-13 DIAGNOSIS — O34593 Maternal care for other abnormalities of gravid uterus, third trimester: Secondary | ICD-10-CM

## 2014-05-13 DIAGNOSIS — O0993 Supervision of high risk pregnancy, unspecified, third trimester: Secondary | ICD-10-CM

## 2014-05-13 DIAGNOSIS — Z1389 Encounter for screening for other disorder: Secondary | ICD-10-CM

## 2014-05-13 DIAGNOSIS — Z131 Encounter for screening for diabetes mellitus: Secondary | ICD-10-CM

## 2014-05-13 DIAGNOSIS — Z0184 Encounter for antibody response examination: Secondary | ICD-10-CM

## 2014-05-13 DIAGNOSIS — Z113 Encounter for screening for infections with a predominantly sexual mode of transmission: Secondary | ICD-10-CM

## 2014-05-13 DIAGNOSIS — Z3483 Encounter for supervision of other normal pregnancy, third trimester: Secondary | ICD-10-CM

## 2014-05-13 LAB — POCT URINALYSIS DIPSTICK
Blood, UA: NEGATIVE
GLUCOSE UA: NEGATIVE
KETONES UA: NEGATIVE
LEUKOCYTES UA: NEGATIVE
Nitrite, UA: NEGATIVE
Protein, UA: NEGATIVE

## 2014-05-13 LAB — CBC
HCT: 30.9 % — ABNORMAL LOW (ref 36.0–46.0)
Hemoglobin: 10.2 g/dL — ABNORMAL LOW (ref 12.0–15.0)
MCH: 29.9 pg (ref 26.0–34.0)
MCHC: 33 g/dL (ref 30.0–36.0)
MCV: 90.6 fL (ref 78.0–100.0)
PLATELETS: 230 10*3/uL (ref 150–400)
RBC: 3.41 MIL/uL — ABNORMAL LOW (ref 3.87–5.11)
RDW: 13.5 % (ref 11.5–15.5)
WBC: 14 10*3/uL — ABNORMAL HIGH (ref 4.0–10.5)

## 2014-05-13 NOTE — Progress Notes (Signed)
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  Urinalysis: Negative   HPI: No complaints. Plans RLTCS     BP weight and urine results all reviewed and noted. Doing PN2 today. Patient reports good fetal movement, denies any bleeding and no rupture of membranes symptoms or regular contractions.  Fundal Height:  33 Fetal Heart rate:  141 Edema:  no  Patient is without complaints. All questions were answered.  Assessment:  [redacted]w[redacted]d,   Uterine didelphys  Medication(s) Plans:  No change  Treatment Plan:  Growth [place] today and q 4 weeks  Follow up in 4 weeks for OB appt, growth [place]

## 2014-05-13 NOTE — Progress Notes (Signed)
U/S(29+0wks)-breech active fetus in Rt uterus, FHR-168 bpm, fluid WNL SDP-4.1cm, posterior Gr 1 placenta, EFw 2 lb 13 oz (48th%tile), Lt uterus appears WNL

## 2014-05-14 LAB — ANTIBODY SCREEN: Antibody Screen: NEGATIVE

## 2014-05-14 LAB — HSV 2 ANTIBODY, IGG: HSV 2 Glycoprotein G Ab, IgG: <0.1 IV

## 2014-05-14 LAB — GLUCOSE TOLERANCE, 2 HOURS W/ 1HR
Glucose, 1 hour: 187 mg/dL — ABNORMAL HIGH (ref 70–170)
Glucose, 2 hour: 100 mg/dL (ref 70–139)
Glucose, Fasting: 89 mg/dL (ref 70–99)

## 2014-05-14 LAB — RPR

## 2014-05-14 LAB — HIV ANTIBODY (ROUTINE TESTING W REFLEX): HIV: NONREACTIVE

## 2014-05-18 DIAGNOSIS — O24410 Gestational diabetes mellitus in pregnancy, diet controlled: Secondary | ICD-10-CM | POA: Insufficient documentation

## 2014-05-18 NOTE — Addendum Note (Signed)
Addended by: Christin Fudge on: 05/18/2014 10:58 AM   Modules accepted: Orders

## 2014-05-27 ENCOUNTER — Telehealth: Payer: Self-pay | Admitting: Advanced Practice Midwife

## 2014-05-27 DIAGNOSIS — O24410 Gestational diabetes mellitus in pregnancy, diet controlled: Secondary | ICD-10-CM

## 2014-05-27 NOTE — Telephone Encounter (Signed)
Had gest diabetes last pregnancy.  Doesn't want to go to classes. Meter/strips/lancet called in

## 2014-05-28 ENCOUNTER — Telehealth: Payer: Self-pay | Admitting: Advanced Practice Midwife

## 2014-05-28 NOTE — Telephone Encounter (Signed)
Accu check glucometer, lancets and strips with 3 refills called to [facility], [place], [place] per Mylo Red note.

## 2014-05-31 ENCOUNTER — Encounter: Payer: Self-pay | Admitting: Women's Health

## 2014-06-09 ENCOUNTER — Ambulatory Visit: Payer: Medicaid Other

## 2014-06-10 ENCOUNTER — Encounter: Payer: Medicaid Other | Admitting: Advanced Practice Midwife

## 2014-06-10 ENCOUNTER — Other Ambulatory Visit: Payer: Medicaid Other

## 2014-06-14 ENCOUNTER — Telehealth: Payer: Self-pay | Admitting: Women's Health

## 2014-06-14 NOTE — Telephone Encounter (Signed)
Spoke with pts husband who states she has a terrible cough and congestion.  Pt reports good fm, no vaginal bleeding and no sudden loss of fluids.  Advised to try Robitussin, cough drops, push fluids and rest when can.  Also told pt husband she can use Cold-Eeze, Mucinex, saline nasal spray, sudafed and Vicks VapoRub if no improvement or symptoms worsen to call us back and we will get her in to be seen, he states her regular OB appointment is for tomorrow.  Pt's husband verbalized understanding.

## 2014-06-15 ENCOUNTER — Ambulatory Visit (INDEPENDENT_AMBULATORY_CARE_PROVIDER_SITE_OTHER): Payer: Medicaid Other | Admitting: Women's Health

## 2014-06-15 ENCOUNTER — Encounter: Payer: Self-pay | Admitting: Women's Health

## 2014-06-15 ENCOUNTER — Ambulatory Visit (INDEPENDENT_AMBULATORY_CARE_PROVIDER_SITE_OTHER): Payer: Medicaid Other

## 2014-06-15 VITALS — BP 122/62 | Temp 98.3°F | Wt 224.0 lb

## 2014-06-15 DIAGNOSIS — O24410 Gestational diabetes mellitus in pregnancy, diet controlled: Secondary | ICD-10-CM

## 2014-06-15 DIAGNOSIS — O34593 Maternal care for other abnormalities of gravid uterus, third trimester: Secondary | ICD-10-CM

## 2014-06-15 DIAGNOSIS — J069 Acute upper respiratory infection, unspecified: Secondary | ICD-10-CM

## 2014-06-15 DIAGNOSIS — Z331 Pregnant state, incidental: Secondary | ICD-10-CM

## 2014-06-15 DIAGNOSIS — O09893 Supervision of other high risk pregnancies, third trimester: Secondary | ICD-10-CM

## 2014-06-15 DIAGNOSIS — Q512 Other doubling of uterus: Secondary | ICD-10-CM

## 2014-06-15 DIAGNOSIS — Z1389 Encounter for screening for other disorder: Secondary | ICD-10-CM

## 2014-06-15 DIAGNOSIS — Q5128 Other doubling of uterus, other specified: Secondary | ICD-10-CM

## 2014-06-15 LAB — POCT URINALYSIS DIPSTICK
Glucose, UA: NEGATIVE
Ketones, UA: NEGATIVE
Leukocytes, UA: NEGATIVE
Nitrite, UA: NEGATIVE
Protein, UA: NEGATIVE
RBC UA: NEGATIVE

## 2014-06-15 MED ORDER — AZITHROMYCIN 250 MG PO TABS: ORAL_TABLET | ORAL | Status: DC

## 2014-06-15 NOTE — Progress Notes (Signed)
U/S(33+5wks)-Didelphys Uterus IUP on RT, Breech active fetus, EFW 4 lb 11 oz (34th%tile), fluid WNL SDP-4.8cm, posterior Gr 1 placenta, unable to view Rt cx on today's exam

## 2014-06-15 NOTE — Addendum Note (Signed)
Addended by: Wells Guiles R on: 06/15/2014 12:31 PM   Modules accepted: Level of Service

## 2014-06-15 NOTE — Progress Notes (Signed)
High Risk Pregnancy Diagnosis(es): A1DM, uterine didelphys G2P1001 [redacted]w[redacted]d Estimated Date of Delivery: 07/29/14 BP 122/62 mmHg  Temp(Src) 98.3 F (36.8 C)  Wt 224 lb (101.606 kg)  LMP 10/22/2013  Urinalysis: Negative HPI:  Hasn't gone to dietician class/went last pregnancy, husband is only one who drives and they can't make it there. States she 'knows what to do'. Has already started cutting out carbs/sweets significantly. Checking cbg's >4x/day b/c she doesn't eat 3 meals, but 5-6 small meals/snacks throughout day. FBS all in 90s, 2hr pp mostly normal- only a few >120 per readings from meter, not writing them in log- urged to write in log so easier to visualize. Has lost 3lbs since last visit.  Has had cold/congestion/cough x 2wks, got better for a little bit, now getting worse.  BP, weight, and urine reviewed.  Reports good fm. Denies regular uc's, lof, vb, uti s/s. No complaints. HRRR, LCTAB, throat clear, no sinus pain to palpation  Fundal Height:  33 Fetal Heart rate:  +u/s Edema:  none  Reviewed today's normal growth u/s. Discussed ptl s/s, fkc All questions were answered Assessment: [redacted]w[redacted]d A1DM, uterine didelphys Medication(s) Plans:  Rx zpak Treatment Plan:  Growth u/s in 4wks, deliver @ 39wks Follow up in 1wk for high-risk OB appt to check on sugars and make sure cold getting better

## 2014-06-15 NOTE — Patient Instructions (Signed)
Call the office ([phone]) or go to [facility] if:  You begin to have strong, frequent contractions  Your water breaks.  Sometimes it is a big gush of fluid, sometimes it is just a trickle that keeps getting your panties wet or running down your legs  You have vaginal bleeding.  It is normal to have a small amount of spotting if your cervix was checked.   You don't feel your baby moving like normal.  If you don't, get you something to eat and drink and lay down and focus on feeling your baby move.  You should feel at least 10 movements in 2 hours.  If you don't, you should call the office or go to [facility].    Preterm Labor Information Preterm labor is when labor starts at less than 37 weeks of pregnancy. The normal length of a pregnancy is 39 to 41 weeks. CAUSES Often, there is no identifiable underlying cause as to why a woman goes into preterm labor. One of the most common known causes of preterm labor is infection. Infections of the uterus, cervix, vagina, amniotic sac, bladder, kidney, or even the lungs (pneumonia) can cause labor to start. Other suspected causes of preterm labor include:   Urogenital infections, such as yeast infections and bacterial vaginosis.   Uterine abnormalities (uterine shape, uterine septum, fibroids, or bleeding from the placenta).   A cervix that has been operated on (it may fail to stay closed).   Malformations in the fetus.   Multiple gestations (twins, triplets, and so on).   Breakage of the amniotic sac.  RISK FACTORS  Having a previous history of preterm labor.   Having premature rupture of membranes (PROM).   Having a placenta that covers the opening of the cervix (placenta previa).   Having a placenta that separates from the uterus (placental abruption).   Having a cervix that is too weak to hold the fetus in the uterus (incompetent cervix).   Having too much fluid in the amniotic sac (polyhydramnios).   Taking  illegal drugs or smoking while pregnant.   Not gaining enough weight while pregnant.   Being younger than 18 and older than 24 years old.   Having a low socioeconomic status.   Being African American. SYMPTOMS Signs and symptoms of preterm labor include:   Menstrual-like cramps, abdominal pain, or back pain.  Uterine contractions that are regular, as frequent as six in an hour, regardless of their intensity (may be mild or painful).  Contractions that start on the top of the uterus and spread down to the lower abdomen and back.   A sense of increased pelvic pressure.   A watery or bloody mucus discharge that comes from the vagina.  TREATMENT Depending on the length of the pregnancy and other circumstances, your health care provider may suggest bed rest. If necessary, there are medicines that can be given to stop contractions and to mature the fetal lungs. If labor happens before 34 weeks of pregnancy, a prolonged hospital stay may be recommended. Treatment depends on the condition of both you and the fetus.  WHAT SHOULD YOU DO IF YOU THINK YOU ARE IN PRETERM LABOR? Call your health care provider right away. You will need to go to the hospital to get checked immediately. HOW CAN YOU PREVENT PRETERM LABOR IN FUTURE PREGNANCIES? You should:   Stop smoking if you smoke.  Maintain healthy weight gain and avoid chemicals and drugs that are not necessary.  Be watchful for   any type of infection.  Inform your health care provider if you have a known history of preterm labor. Document Released: 10/06/2003 Document Revised: 03/18/2013 Document Reviewed: 08/18/2012 [employer] Patient Information 2015 [employer]. This information is not intended to replace advice given to you by your health care provider. Make sure you discuss any questions you have with your health care provider.  

## 2014-06-22 ENCOUNTER — Encounter: Payer: Medicaid Other | Admitting: Obstetrics & Gynecology

## 2014-06-23 ENCOUNTER — Encounter: Payer: Self-pay | Admitting: Obstetrics & Gynecology

## 2014-06-23 ENCOUNTER — Ambulatory Visit (INDEPENDENT_AMBULATORY_CARE_PROVIDER_SITE_OTHER): Payer: Medicaid Other | Admitting: Obstetrics & Gynecology

## 2014-06-23 VITALS — BP 110/60 | Wt 228.0 lb

## 2014-06-23 DIAGNOSIS — Z331 Pregnant state, incidental: Secondary | ICD-10-CM

## 2014-06-23 DIAGNOSIS — O24410 Gestational diabetes mellitus in pregnancy, diet controlled: Secondary | ICD-10-CM

## 2014-06-23 DIAGNOSIS — O09893 Supervision of other high risk pregnancies, third trimester: Secondary | ICD-10-CM

## 2014-06-23 DIAGNOSIS — N390 Urinary tract infection, site not specified: Secondary | ICD-10-CM

## 2014-06-23 DIAGNOSIS — Z1389 Encounter for screening for other disorder: Secondary | ICD-10-CM

## 2014-06-23 LAB — POCT URINALYSIS DIPSTICK
Glucose, UA: NEGATIVE
Ketones, UA: NEGATIVE
Leukocytes, UA: NEGATIVE
Nitrite, UA: POSITIVE
RBC UA: NEGATIVE

## 2014-06-23 NOTE — Addendum Note (Signed)
Addended by: Doyne Keel on: 06/23/2014 12:02 PM   Modules accepted: Orders

## 2014-06-23 NOTE — Progress Notes (Signed)
Blood sugars are good   High Risk Pregnancy Diagnosis(es):   Class A1 DM, uterine didelphys  G2P1001 109w6d Estimated Date of Delivery: 07/29/14  Blood pressure 110/60, weight 228 lb (103.42 kg), last menstrual period 10/22/2013.  Urinalysis: Negative   HPI: Discussed nutritional guidelines     BP weight and urine results all reviewed and noted. Patient reports good fetal movement, denies any bleeding and no rupture of membranes symptoms or regular contractions.  Fundal Height:  35 Fetal Heart rate:  145 Edema:  none  Patient is without complaints. All questions were answered.  Assessment:  [redacted]w[redacted]d,   Class A1 DM  Medication(s) Plans:  No changes  Treatment Plan:  weekly  Follow up in 1 weeks for OB appt, routine

## 2014-07-02 ENCOUNTER — Telehealth: Payer: Self-pay | Admitting: Obstetrics & Gynecology

## 2014-07-02 NOTE — Telephone Encounter (Signed)
Pt states needs Softclix and not the fastclix lancets. Called [facility], [place], [place] and gave verbal order for Softclix Lancets vs Fastclix per Mylo Red, CNM.

## 2014-07-06 ENCOUNTER — Encounter: Payer: Medicaid Other | Admitting: Obstetrics & Gynecology

## 2014-07-07 ENCOUNTER — Other Ambulatory Visit: Payer: Self-pay | Admitting: Obstetrics and Gynecology

## 2014-07-07 ENCOUNTER — Encounter: Payer: Self-pay | Admitting: Obstetrics and Gynecology

## 2014-07-07 ENCOUNTER — Ambulatory Visit (INDEPENDENT_AMBULATORY_CARE_PROVIDER_SITE_OTHER): Payer: Medicaid Other | Admitting: Obstetrics and Gynecology

## 2014-07-07 VITALS — BP 120/76 | Wt 234.0 lb

## 2014-07-07 DIAGNOSIS — Z118 Encounter for screening for other infectious and parasitic diseases: Secondary | ICD-10-CM

## 2014-07-07 DIAGNOSIS — O24420 Gestational diabetes mellitus in childbirth, diet controlled: Secondary | ICD-10-CM | POA: Insufficient documentation

## 2014-07-07 DIAGNOSIS — Z1159 Encounter for screening for other viral diseases: Secondary | ICD-10-CM

## 2014-07-07 DIAGNOSIS — Z3685 Encounter for antenatal screening for Streptococcus B: Secondary | ICD-10-CM

## 2014-07-07 DIAGNOSIS — Z1389 Encounter for screening for other disorder: Secondary | ICD-10-CM

## 2014-07-07 DIAGNOSIS — Z331 Pregnant state, incidental: Secondary | ICD-10-CM

## 2014-07-07 DIAGNOSIS — O0993 Supervision of high risk pregnancy, unspecified, third trimester: Secondary | ICD-10-CM

## 2014-07-07 LAB — POCT URINALYSIS DIPSTICK
Blood, UA: NEGATIVE
Glucose, UA: NEGATIVE
KETONES UA: NEGATIVE
LEUKOCYTES UA: NEGATIVE
NITRITE UA: NEGATIVE
Protein, UA: NEGATIVE

## 2014-07-07 NOTE — Progress Notes (Signed)
Pt denies any problems or concerns at this time.  

## 2014-07-07 NOTE — Progress Notes (Addendum)
High Risk Pregnancy Diagnosis(es):   Class A1 DM, uterine didelphys, breech presentation   G2P1001 [redacted]w[redacted]d Estimated Date of Delivery: 07/29/14  Blood pressure 120/76, weight 234 lb (106.142 kg), last menstrual period 10/22/2013.  Urinalysis: Negative   HPI: She reports that this will be her second baby and a repeat c-section. She voices concerns for when the C-Section will occur POSTED FOR 12/24. She denies any other symptoms. She plans on doing depo-provera after this birth. She has problems with weight gain while on depo-provera. She has had the implant in the arm before. She was informed that once she stopped breastfeeding she can do the nuva-ring. She breast fed her first born for only 3 months.   BP weight and urine results all reviewed and noted. Patient reports good fetal movement, denies any bleeding and no rupture of membranes symptoms or regular contractions.  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 cbg's are optimal. All questions were answered.  Assessment:  [redacted]w[redacted]d,   Repeat C-section, UTERUS DIDELPHYs, DMA-1 optimal control Medication(s) Plans:  No change Treatment Plan:  No change  Plan:  1. Schedule repeat C-section on 07/22/2014 9 am jvferg 2. Follow up in 1 weeks for OB appt, review of cbg's  This chart was scribed for Jonnie Kind, MD by Steva Colder, ED Scribe. The patient was seen in room 2 at 12:29 PM.

## 2014-07-07 NOTE — Progress Notes (Signed)
Tracy Berger  Addendum  Progress Notes 07/07/2014 12:29 PM    Expand All Collapse All   High Risk Pregnancy Diagnosis(es): Class A1 DM, uterine didelphys, breech presentation   G2P1001 [redacted]w[redacted]d Estimated Date of Delivery: 07/29/14  Blood pressure 120/76, weight 234 lb (106.142 kg), last menstrual period 10/22/2013.  Urinalysis: Negative   HPI: She reports that this will be her second baby and a repeat c-section. She voices concerns for when the C-Section will occur POSTED FOR 12/24. She denies any other symptoms. She plans on doing depo-provera after this birth. She has problems with weight gain while on depo-provera. She has had the implant in the arm before. She was informed that once she stopped breastfeeding she can do the nuva-ring. She breast fed her first born for only 3 months.   BP weight and urine results all reviewed and noted. Patient reports good fetal movement, denies any bleeding and no rupture of membranes symptoms or regular contractions.  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 cbg's are optimal. All questions were answered.  Assessment: [redacted]w[redacted]d, Repeat C-section, UTERUS DIDELPHYs, DMA-1 optimal control Medication(s) Plans: No change Treatment Plan: No change  Plan:  1. Schedule repeat C-section on 07/22/2014 9 am jvferg 2. Follow up in 1 weeks for OB appt, review of cbg's  This chart was scribed for Jonnie Kind, MD by Steva Colder, ED Scribe. The patient was seen in room 2 at 12:29 PM.         Revision  History     Date/Time User Provider Type Action   07/07/2014 1:08 PM Jonnie Kind, MD Physician Addend  See the note below by [facility], done in collaboration at appt.

## 2014-07-08 LAB — GC/CHLAMYDIA PROBE AMP
CT Probe RNA: NEGATIVE
GC PROBE AMP APTIMA: NEGATIVE

## 2014-07-09 LAB — STREP B DNA PROBE: GBSP: NOT DETECTED

## 2014-07-14 ENCOUNTER — Ambulatory Visit (INDEPENDENT_AMBULATORY_CARE_PROVIDER_SITE_OTHER): Payer: Medicaid Other | Admitting: Obstetrics and Gynecology

## 2014-07-14 ENCOUNTER — Encounter: Payer: Self-pay | Admitting: Obstetrics and Gynecology

## 2014-07-14 ENCOUNTER — Other Ambulatory Visit: Payer: Self-pay | Admitting: Obstetrics and Gynecology

## 2014-07-14 DIAGNOSIS — Z331 Pregnant state, incidental: Secondary | ICD-10-CM

## 2014-07-14 DIAGNOSIS — O24420 Gestational diabetes mellitus in childbirth, diet controlled: Secondary | ICD-10-CM

## 2014-07-14 DIAGNOSIS — O0993 Supervision of high risk pregnancy, unspecified, third trimester: Secondary | ICD-10-CM

## 2014-07-14 DIAGNOSIS — Z1389 Encounter for screening for other disorder: Secondary | ICD-10-CM

## 2014-07-14 LAB — POCT URINALYSIS DIPSTICK
Glucose, UA: NEGATIVE
KETONES UA: NEGATIVE
Leukocytes, UA: NEGATIVE
Nitrite, UA: NEGATIVE
Protein, UA: NEGATIVE
RBC UA: NEGATIVE

## 2014-07-14 NOTE — Progress Notes (Signed)
High Risk Pregnancy Diagnosis(es):   Class A1 DM, uterine didelphys, breech presentation  G2P1001 [redacted]w[redacted]d Estimated Date of Delivery: 07/29/14  Blood pressure 116/60, weight 235 lb (106.595 kg), last menstrual period 10/22/2013.  Urinalysis: Negative   HPI: She reports that she has changed her mind about her birth control methods after birth. She would like Nexplanon instead of depo-provera.  She notes that the baby is being active. She denies any other symptoms.   BP weight and urine results all reviewed and noted. Patient reports good fetal movement, denies any bleeding and no rupture of membranes symptoms or regular contractions.  Fundal Height:  39 cm Breech, vertex in RUQ Fetal Heart rate:  141 Edema:  none  Patient is without complaints. All questions were answered.  Assessment:  [redacted]w[redacted]d,  Uterine didelphys, breech presentation, Class A1 DM  Medication(s) Plans:  No change  Treatment Plan:  No change  Follow up in 1 weeks for pre op   Plan:  1.f/u in 1 week for pre op&  final prenatal visit  Repeat C-section on 07/22/14 2. F/U 4 weeks for pp visit  This chart was scribed for Jonnie Kind, MD by Steva Colder, ED Scribe. The patient was seen in room 2 at 10:02 AM.

## 2014-07-14 NOTE — Progress Notes (Signed)
Pt denies any problems or concerns at this time.  

## 2014-07-20 ENCOUNTER — Inpatient Hospital Stay (HOSPITAL_COMMUNITY): Payer: Medicaid Other | Admitting: Anesthesiology

## 2014-07-20 ENCOUNTER — Inpatient Hospital Stay (HOSPITAL_COMMUNITY)
Admission: AD | Admit: 2014-07-20 | Discharge: 2014-07-22 | DRG: 766 | Disposition: A | Discharge status: Home / Self Care | Payer: Medicaid Other | Source: Ambulatory Visit | Attending: Family Medicine | Admitting: Family Medicine

## 2014-07-20 ENCOUNTER — Encounter (HOSPITAL_COMMUNITY): Payer: Self-pay

## 2014-07-20 ENCOUNTER — Encounter (HOSPITAL_COMMUNITY)
Admission: AD | Disposition: A | Discharge status: Home / Self Care | Payer: Self-pay | Source: Ambulatory Visit | Attending: Family Medicine

## 2014-07-20 ENCOUNTER — Other Ambulatory Visit (HOSPITAL_COMMUNITY): Payer: Medicaid Other

## 2014-07-20 ENCOUNTER — Encounter (HOSPITAL_COMMUNITY): Payer: Self-pay | Admitting: *Deleted

## 2014-07-20 DIAGNOSIS — Z8249 Family history of ischemic heart disease and other diseases of the circulatory system: Secondary | ICD-10-CM | POA: Diagnosis not present

## 2014-07-20 DIAGNOSIS — O09893 Supervision of other high risk pregnancies, third trimester: Secondary | ICD-10-CM

## 2014-07-20 DIAGNOSIS — O328XX0 Maternal care for other malpresentation of fetus, not applicable or unspecified: Principal | ICD-10-CM | POA: Diagnosis present

## 2014-07-20 DIAGNOSIS — Q513 Bicornate uterus: Secondary | ICD-10-CM

## 2014-07-20 DIAGNOSIS — O24410 Gestational diabetes mellitus in pregnancy, diet controlled: Secondary | ICD-10-CM | POA: Diagnosis present

## 2014-07-20 DIAGNOSIS — Z800 Family history of malignant neoplasm of digestive organs: Secondary | ICD-10-CM | POA: Diagnosis not present

## 2014-07-20 DIAGNOSIS — Z833 Family history of diabetes mellitus: Secondary | ICD-10-CM | POA: Diagnosis not present

## 2014-07-20 DIAGNOSIS — Z3A38 38 weeks gestation of pregnancy: Secondary | ICD-10-CM | POA: Diagnosis present

## 2014-07-20 DIAGNOSIS — O34593 Maternal care for other abnormalities of gravid uterus, third trimester: Secondary | ICD-10-CM | POA: Diagnosis present

## 2014-07-20 DIAGNOSIS — Z98891 History of uterine scar from previous surgery: Secondary | ICD-10-CM

## 2014-07-20 DIAGNOSIS — O3421 Maternal care for scar from previous cesarean delivery: Secondary | ICD-10-CM | POA: Diagnosis present

## 2014-07-20 DIAGNOSIS — Z87891 Personal history of nicotine dependence: Secondary | ICD-10-CM

## 2014-07-20 LAB — CBC
HCT: 30.3 % — ABNORMAL LOW (ref 36.0–46.0)
Hemoglobin: 10 g/dL — ABNORMAL LOW (ref 12.0–15.0)
MCH: 29.2 pg (ref 26.0–34.0)
MCHC: 33 g/dL (ref 30.0–36.0)
MCV: 88.6 fL (ref 78.0–100.0)
Platelets: 255 10*3/uL (ref 150–400)
RBC: 3.42 MIL/uL — ABNORMAL LOW (ref 3.87–5.11)
RDW: 14.5 % (ref 11.5–15.5)
WBC: 14.1 10*3/uL — AB (ref 4.0–10.5)

## 2014-07-20 LAB — POCT FERN TEST: POCT FERN TEST: POSITIVE

## 2014-07-20 LAB — GLUCOSE, CAPILLARY: GLUCOSE-CAPILLARY: 97 mg/dL (ref 70–99)

## 2014-07-20 SURGERY — Surgical Case
Anesthesia: Spinal | Site: Abdomen

## 2014-07-20 MED ORDER — MEPERIDINE HCL 25 MG/ML IJ SOLN
INTRAMUSCULAR | Status: AC
  Filled 2014-07-20: qty 1

## 2014-07-20 MED ORDER — BUPIVACAINE IN DEXTROSE 0.75-8.25 % IT SOLN
INTRATHECAL | Status: DC | PRN
  Administered 2014-07-20: 12 mg via INTRATHECAL

## 2014-07-20 MED ORDER — FENTANYL CITRATE 0.05 MG/ML IJ SOLN
INTRAMUSCULAR | Status: AC
  Filled 2014-07-20: qty 2

## 2014-07-20 MED ORDER — HYDROMORPHONE HCL 1 MG/ML IJ SOLN: 0.2500 mg | INTRAMUSCULAR | Status: DC | PRN

## 2014-07-20 MED ORDER — MEPERIDINE HCL 25 MG/ML IJ SOLN
INTRAMUSCULAR | Status: DC | PRN
  Administered 2014-07-20 (×2): 12.5 mg via INTRAVENOUS

## 2014-07-20 MED ORDER — FENTANYL CITRATE 0.05 MG/ML IJ SOLN
INTRAMUSCULAR | Status: DC | PRN
  Administered 2014-07-20: 87.5 ug via INTRAVENOUS
  Administered 2014-07-20: 12.5 ug via INTRATHECAL

## 2014-07-20 MED ORDER — PROMETHAZINE HCL 25 MG/ML IJ SOLN: 6.2500 mg | INTRAMUSCULAR | Status: DC | PRN

## 2014-07-20 MED ORDER — DIPHENHYDRAMINE HCL 50 MG/ML IJ SOLN
INTRAMUSCULAR | Status: AC
  Filled 2014-07-20: qty 1

## 2014-07-20 MED ORDER — MORPHINE SULFATE 0.5 MG/ML IJ SOLN
INTRAMUSCULAR | Status: AC
  Filled 2014-07-20: qty 10

## 2014-07-20 MED ORDER — DEXAMETHASONE SODIUM PHOSPHATE 4 MG/ML IJ SOLN
INTRAMUSCULAR | Status: DC | PRN
  Administered 2014-07-20: 4 mg via INTRAVENOUS

## 2014-07-20 MED ORDER — OXYTOCIN 10 UNIT/ML IJ SOLN
40.0000 [IU] | INTRAVENOUS | Status: DC | PRN
  Administered 2014-07-20: 40 [IU] via INTRAVENOUS

## 2014-07-20 MED ORDER — KETOROLAC TROMETHAMINE 30 MG/ML IJ SOLN
30.0000 mg | Freq: Four times a day (QID) | INTRAMUSCULAR | Status: AC | PRN
  Administered 2014-07-21: 30 mg via INTRAVENOUS

## 2014-07-20 MED ORDER — MEPERIDINE HCL 25 MG/ML IJ SOLN: 6.2500 mg | INTRAMUSCULAR | Status: DC | PRN

## 2014-07-20 MED ORDER — ONDANSETRON HCL 4 MG/2ML IJ SOLN
INTRAMUSCULAR | Status: AC
  Filled 2014-07-20: qty 2

## 2014-07-20 MED ORDER — LACTATED RINGERS IV SOLN
INTRAVENOUS | Status: DC
  Administered 2014-07-20 (×3): via INTRAVENOUS

## 2014-07-20 MED ORDER — PHENYLEPHRINE 8 MG IN D5W 100 ML (0.08MG/ML) PREMIX OPTIME
INJECTION | INTRAVENOUS | Status: AC
  Filled 2014-07-20: qty 100

## 2014-07-20 MED ORDER — MORPHINE SULFATE (PF) 0.5 MG/ML IJ SOLN
INTRAMUSCULAR | Status: DC | PRN
  Administered 2014-07-20: .2 mg via EPIDURAL

## 2014-07-20 MED ORDER — CEFAZOLIN SODIUM-DEXTROSE 2-3 GM-% IV SOLR
2.0000 g | INTRAVENOUS | Status: AC
  Administered 2014-07-20: 2 g via INTRAVENOUS
  Filled 2014-07-20: qty 50

## 2014-07-20 MED ORDER — DEXAMETHASONE SODIUM PHOSPHATE 4 MG/ML IJ SOLN
INTRAMUSCULAR | Status: AC
  Filled 2014-07-20: qty 1

## 2014-07-20 MED ORDER — BUPIVACAINE HCL (PF) 0.25 % IJ SOLN
INTRAMUSCULAR | Status: DC | PRN
  Administered 2014-07-20: 30 mL

## 2014-07-20 MED ORDER — SCOPOLAMINE 1 MG/3DAYS TD PT72
MEDICATED_PATCH | TRANSDERMAL | Status: AC
  Filled 2014-07-20: qty 1

## 2014-07-20 MED ORDER — PHENYLEPHRINE 8 MG IN D5W 100 ML (0.08MG/ML) PREMIX OPTIME
INJECTION | INTRAVENOUS | Status: DC | PRN
  Administered 2014-07-20: 45 ug/min via INTRAVENOUS

## 2014-07-20 MED ORDER — CITRIC ACID-SODIUM CITRATE 334-500 MG/5ML PO SOLN
30.0000 mL | Freq: Once | ORAL | Status: AC
  Administered 2014-07-20: 30 mL via ORAL
  Filled 2014-07-20: qty 15

## 2014-07-20 MED ORDER — SCOPOLAMINE 1 MG/3DAYS TD PT72
MEDICATED_PATCH | TRANSDERMAL | Status: DC | PRN
  Administered 2014-07-20: 1 via TRANSDERMAL

## 2014-07-20 MED ORDER — OXYTOCIN 10 UNIT/ML IJ SOLN
INTRAMUSCULAR | Status: AC
  Filled 2014-07-20: qty 4

## 2014-07-20 MED ORDER — ONDANSETRON HCL 4 MG/2ML IJ SOLN
INTRAMUSCULAR | Status: DC | PRN
  Administered 2014-07-20: 4 mg via INTRAVENOUS

## 2014-07-20 MED ORDER — KETOROLAC TROMETHAMINE 30 MG/ML IJ SOLN: 30.0000 mg | Freq: Four times a day (QID) | INTRAMUSCULAR | Status: AC | PRN

## 2014-07-20 MED ORDER — KETOROLAC TROMETHAMINE 30 MG/ML IJ SOLN
INTRAMUSCULAR | Status: AC
  Filled 2014-07-20: qty 1

## 2014-07-20 SURGICAL SUPPLY — 38 items
APL SKNCLS STERI-STRIP NONHPOA (GAUZE/BANDAGES/DRESSINGS) ×1
BENZOIN TINCTURE PRP APPL 2/3 (GAUZE/BANDAGES/DRESSINGS) ×3 IMPLANT
CATH ROBINSON RED A/P 16FR (CATHETERS) IMPLANT
CLAMP CORD UMBIL (MISCELLANEOUS) IMPLANT
CLOSURE WOUND 1/2 X4 (GAUZE/BANDAGES/DRESSINGS) ×2
CLOTH BEACON ORANGE TIMEOUT ST (SAFETY) ×3 IMPLANT
DRAPE SHEET LG 3/4 BI-LAMINATE (DRAPES) IMPLANT
DRSG OPSITE POSTOP 4X10 (GAUZE/BANDAGES/DRESSINGS) ×3 IMPLANT
DURAPREP 26ML APPLICATOR (WOUND CARE) ×3 IMPLANT
ELECT REM PT RETURN 9FT ADLT (ELECTROSURGICAL) ×3
ELECTRODE REM PT RTRN 9FT ADLT (ELECTROSURGICAL) ×1 IMPLANT
EXTRACTOR VACUUM M CUP 4 TUBE (SUCTIONS) IMPLANT
EXTRACTOR VACUUM M CUP 4' TUBE (SUCTIONS)
GLOVE BIOGEL PI IND STRL 7.5 (GLOVE) ×2 IMPLANT
GLOVE BIOGEL PI INDICATOR 7.5 (GLOVE) ×4
GLOVE ECLIPSE 7.5 STRL STRAW (GLOVE) ×3 IMPLANT
GOWN STRL REUS W/TWL LRG LVL3 (GOWN DISPOSABLE) ×9 IMPLANT
KIT ABG SYR 3ML LUER SLIP (SYRINGE) IMPLANT
NDL HYPO 25X5/8 SAFETYGLIDE (NEEDLE) IMPLANT
NEEDLE HYPO 22GX1.5 SAFETY (NEEDLE) ×3 IMPLANT
NEEDLE HYPO 25X5/8 SAFETYGLIDE (NEEDLE) ×3 IMPLANT
NS IRRIG 1000ML POUR BTL (IV SOLUTION) ×3 IMPLANT
PACK C SECTION WH (CUSTOM PROCEDURE TRAY) ×3 IMPLANT
PAD OB MATERNITY 4.3X12.25 (PERSONAL CARE ITEMS) ×3 IMPLANT
RTRCTR C-SECT PINK 25CM LRG (MISCELLANEOUS) ×2 IMPLANT
STRIP CLOSURE SKIN 1/2X4 (GAUZE/BANDAGES/DRESSINGS) ×3 IMPLANT
SUT GUT PLAIN 0 CT-3 TAN 27 (SUTURE) ×2 IMPLANT
SUT MNCRL 0 VIOLET CTX 36 (SUTURE) IMPLANT
SUT MONOCRYL 0 CTX 36 (SUTURE)
SUT VIC AB 0 CTX 36 (SUTURE) ×9
SUT VIC AB 0 CTX36XBRD ANBCTRL (SUTURE) ×3 IMPLANT
SUT VIC AB 2-0 CT1 27 (SUTURE) ×3
SUT VIC AB 2-0 CT1 TAPERPNT 27 (SUTURE) ×1 IMPLANT
SUT VIC AB 4-0 KS 27 (SUTURE) ×3 IMPLANT
SYR 30ML LL (SYRINGE) ×3 IMPLANT
TOWEL OR 17X24 6PK STRL BLUE (TOWEL DISPOSABLE) ×3 IMPLANT
TRAY FOLEY CATH 14FR (SET/KITS/TRAYS/PACK) ×3 IMPLANT
WATER STERILE IRR 1000ML POUR (IV SOLUTION) ×1 IMPLANT

## 2014-07-20 NOTE — Transfer of Care (Signed)
Immediate Anesthesia Transfer of Care Note  Patient: Tracy Berger  Procedure(s) Performed: Procedure(s): CESAREAN SECTION (N/A)  Patient Location: PACU  Anesthesia Type:Spinal  Level of Consciousness: awake  Airway & Oxygen Therapy: Patient Spontanous Breathing  Post-op Assessment: Report given to PACU RN and Post -op Vital signs reviewed and stable  Post vital signs: stable  Complications: No apparent anesthesia complications

## 2014-07-20 NOTE — MAU Note (Signed)
PT ARRIVED  WITH WET PANTS -  SAYS  [facility]  AT 730PM.    PNC - WITH FAMILY  TREE.     IS  [facility] FOR REPEAT C/S  NEXT WEEK

## 2014-07-20 NOTE — Anesthesia Preprocedure Evaluation (Signed)
Anesthesia Evaluation  Patient identified by MRN, date of birth, ID band Patient awake    Reviewed: Allergy & Precautions, H&P , NPO status , Patient's Chart, lab work & pertinent test results  Airway Mallampati: II  TM Distance: >3 FB Neck ROM: full    Dental no notable dental hx.    Pulmonary former smoker,    Pulmonary exam normal       Cardiovascular negative cardio ROS      Neuro/Psych negative neurological ROS  negative psych ROS   GI/Hepatic negative GI ROS, Neg liver ROS,   Endo/Other  diabetes, GestationalMorbid obesity  Renal/GU negative Renal ROS     Musculoskeletal   Abdominal (+) + obese,   Peds  Hematology negative hematology ROS (+)   Anesthesia Other Findings   Reproductive/Obstetrics (+) Pregnancy                             Anesthesia Physical Anesthesia Plan  ASA: III and emergent  Anesthesia Plan: Spinal   Post-op Pain Management:    Induction:   Airway Management Planned:   Additional Equipment:   Intra-op Plan:   Post-operative Plan:   Informed Consent: I have reviewed the patients History and Physical, chart, labs and discussed the procedure including the risks, benefits and alternatives for the proposed anesthesia with the patient or authorized representative who has indicated his/her understanding and acceptance.     Plan Discussed with: CRNA and Surgeon  Anesthesia Plan Comments:         Anesthesia Quick Evaluation

## 2014-07-20 NOTE — Anesthesia Procedure Notes (Signed)
Spinal Patient location during procedure: OR Start time: 07/20/2014 9:20 PM End time: 07/20/2014 9:26 PM Staffing Anesthesiologist: Lyn Hollingshead Performed by: anesthesiologist  Preanesthetic Checklist Completed: patient identified, surgical consent, pre-op evaluation, timeout performed, IV checked, risks and benefits discussed and monitors and equipment checked Spinal Block Patient position: sitting Prep: DuraPrep Patient monitoring: heart rate, cardiac monitor, continuous pulse ox and blood pressure Approach: midline Location: L3-4 Injection technique: single-shot Needle Needle type: Pencan  Needle gauge: 24 G Needle length: 9 cm Needle insertion depth: 7 cm Assessment Sensory level: T2

## 2014-07-20 NOTE — MAU Provider Note (Signed)
Faculty Practice H&P  Tracy Berger is a 23 y.o. female G2P1001 with IUP at [redacted]w[redacted]d presenting to MAU with SROM about 1 hour ago.  Contractions started shortly after.  On exam, has a foot presentation.  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    Pt states she has been having moderate contractions, no vaginal bleeding, ruptured membranes, with normal fetal movement.     Prenatal Course Source of Care: FT with onset of care at [facility] Pregnancy complications or risks: Patient Active Problem List   Diagnosis Date Noted  . Gestational diabetes mellitus in childbirth, diet controlled 07/07/2014  . Gestational diabetes mellitus, class A1 05/18/2014  . Previous cesarean delivery, antepartum 01/20/2014  . HOH (hard of hearing) 01/05/2014  . Supervision of other high-risk pregnancy 01/05/2014  . 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 GERD 11/24/2008  . GI BLEEDING 11/24/2008   She desires to Depo-Provera.  She plans to plans to breastfeed  Prenatal labs and studies: ABO, Rh: A/POS/-- (06/09 1550) Antibody: NEG (10/15 0916) Rubella:   RPR: NON REAC (10/15 0916)  HBsAg: NEGATIVE (06/09 1550)  HIV: NONREACTIVE (10/15 0916)  GBS: NOT DETECTED (12/09 1252)   2hr Glucola 89, 187, 100 Genetic screeningnormal Anatomy US normal  Past Medical History:  Past Medical History  Diagnosis Date  . Gestational diabetes     with 1st pregnancy  . Didelphic uterus   . HOH (hard of hearing) 01/05/2014  . Supervision of other high-risk pregnancy(V23.89) 01/05/2014  . 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 Cesarean section    . Wisdom tooth extraction      Obstetrical History:  OB History    Gravida Para Term Preterm AB TAB SAB Ectopic Multiple Living   2 1 1       1       Gynecological History:  OB History    Gravida  Para Term Preterm AB TAB SAB Ectopic Multiple Living   2 1 1       1       Social History:  History   Social History  . Marital Status: Married    Spouse Name: N/A    Number of Children: N/A  . Years of Education: N/A   Social History Main Topics  . Smoking status: Former Smoker    Types: Cigarettes  . Smokeless tobacco: Never Used  . Alcohol Use: No  . Drug Use: No  . Sexual Activity: Yes    Birth Control/ Protection: None   Other Topics Concern  . None   Social History Narrative    Family History:  Family History  Problem Relation Age of Onset  . Cancer Mother 43    colon  . Hearing loss Mother   . Hypertension Father   . Diabetes Father   . Birth defects Maternal Uncle     hole in his heart at birth  . Hearing loss Maternal Uncle   . Diabetes Paternal Aunt   . Diabetes Paternal Uncle   . Heart disease Maternal Grandmother   . Hearing loss Maternal Grandmother   . Diabetes Maternal Grandfather   . Diabetes Paternal Grandmother     Medications:  Prenatal vitamins,  No current facility-administered medications for this encounter.    Allergies: No Known Allergies  Review of Systems: - negative  Physical Exam: Last menstrual period 10/22/2013. GENERAL: Well-developed, well-nourished female in no acute distress.  LUNGS: Clear to auscultation bilaterally.  HEART: Regular rate and rhythm. ABDOMEN: Soft, nontender, nondistended, gravid. EFW 7.5 lbs EXTREMITIES: Nontender, no edema, 2+ distal pulses. Cervical Exam: Dilatation 3 cm   Effacement 80%   Station -3   Presentation: breech footling FHT:  Baseline rate 150 bpm   Variability moderate  Accelerations present   Decelerations none Contractions: Every 5 mins   Pertinent Labs/Studies: none  Assessment : Tracy Berger is a 23 y.o. G2P1001 at [redacted]w[redacted]d being admitted for cesarean section secondary to breech with SROM.    Plan: The risks of cesarean section discussed with the patient included but were  not limited to: bleeding which may require transfusion or reoperation; infection which may require antibiotics; injury to bowel, bladder, ureters or other surrounding organs; injury to the fetus; need for additional procedures including hysterectomy in the event of a life-threatening hemorrhage; placental abnormalities wth subsequent pregnancies, incisional problems, thromboembolic phenomenon and other postoperative/anesthesia complications. The patient concurred with the proposed plan, giving informed written consent for the procedure.   Patient has been NPO since 4pm and will remain NPO for procedure.  Preoperative prophylactic Ancef ordered on call to the OR.    Tracy Berger 07/20/2014, 8:32 PM

## 2014-07-20 NOTE — Anesthesia Preprocedure Evaluation (Signed)
Anesthesia Evaluation  Patient identified by MRN, date of birth, ID band Patient awake    Reviewed: Allergy & Precautions, H&P , NPO status , Patient's Chart, lab work & pertinent test results  Airway Mallampati: II  TM Distance: >3 FB Neck ROM: full    Dental no notable dental hx.    Pulmonary former smoker,    Pulmonary exam normal       Cardiovascular negative cardio ROS      Neuro/Psych negative neurological ROS  negative psych ROS   GI/Hepatic negative GI ROS, Neg liver ROS,   Endo/Other  diabetes, GestationalMorbid obesity  Renal/GU negative Renal ROS     Musculoskeletal   Abdominal (+) + obese,   Peds  Hematology negative hematology ROS (+)   Anesthesia Other Findings   Reproductive/Obstetrics (+) Pregnancy                             Anesthesia Physical  Anesthesia Plan  ASA: III and emergent  Anesthesia Plan: Spinal   Post-op Pain Management:    Induction:   Airway Management Planned:   Additional Equipment:   Intra-op Plan:   Post-operative Plan:   Informed Consent: I have reviewed the patients History and Physical, chart, labs and discussed the procedure including the risks, benefits and alternatives for the proposed anesthesia with the patient or authorized representative who has indicated his/her understanding and acceptance.     Plan Discussed with: CRNA and Surgeon  Anesthesia Plan Comments:         Anesthesia Quick Evaluation

## 2014-07-20 NOTE — Addendum Note (Signed)
Addendum  created 07/20/14 2350 by Lyn Hollingshead, MD   Modules edited: Orders, PRL Based Order Sets

## 2014-07-20 NOTE — Anesthesia Postprocedure Evaluation (Signed)
Anesthesia Post Note  Patient: Tracy Berger  Procedure(s) Performed: Procedure(s) (LRB): CESAREAN SECTION (N/A)  Anesthesia type: Spinal  Patient location: PACU  Post pain: Pain level controlled  Post assessment: Post-op Vital signs reviewed  Last Vitals:  Filed Vitals:   07/20/14 2251  BP: 114/63  Pulse: 87  Temp: 36.8 C  Resp: 38    Post vital signs: Reviewed  Level of consciousness: awake  Complications: No apparent anesthesia complications

## 2014-07-20 NOTE — H&P (Addendum)
Faculty Practice H&P  Tracy Berger is a 23 y.o. female G2P1001 with IUP at [redacted]w[redacted]d presenting to MAU with SROM about 1 hour ago.  Contractions started shortly after.  On exam, has a foot presentation.  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    Pt states she has been having moderate contractions, no vaginal bleeding, ruptured membranes, with normal fetal movement.     Prenatal Course Source of Care: FT with onset of care at [facility] Pregnancy complications or risks: Patient Active Problem List   Diagnosis Date Noted  . Gestational diabetes mellitus in childbirth, diet controlled 07/07/2014  . Gestational diabetes mellitus, class A1 05/18/2014  . Previous cesarean delivery, antepartum 01/20/2014  . HOH (hard of hearing) 01/05/2014  . Supervision of other high-risk pregnancy 01/05/2014  . 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 GERD 11/24/2008  . GI BLEEDING 11/24/2008   She desires to Depo-Provera.  She plans to plans to breastfeed  Prenatal labs and studies: ABO, Rh: A/POS/-- (06/09 1550) Antibody: NEG (10/15 0916) Rubella:   RPR: NON REAC (10/15 0916)  HBsAg: NEGATIVE (06/09 1550)  HIV: NONREACTIVE (10/15 0916)  GBS: NOT DETECTED (12/09 1252)   2hr Glucola 89, 187, 100 Genetic screeningnormal Anatomy US normal  Past Medical History:  Past Medical History  Diagnosis Date  . Gestational diabetes     with 1st pregnancy  . Didelphic uterus   . HOH (hard of hearing) 01/05/2014  . Supervision of other high-risk pregnancy(V23.89) 01/05/2014  . 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 Cesarean section    . Wisdom tooth extraction      Obstetrical History:  OB History    Gravida Para Term Preterm AB TAB SAB Ectopic Multiple Living   2 1 1       1       Gynecological History:  OB History    Gravida  Para Term Preterm AB TAB SAB Ectopic Multiple Living   2 1 1       1       Social History:  History   Social History  . Marital Status: Married    Spouse Name: N/A    Number of Children: N/A  . Years of Education: N/A   Social History Main Topics  . Smoking status: Former Smoker    Types: Cigarettes  . Smokeless tobacco: Never Used  . Alcohol Use: No  . Drug Use: No  . Sexual Activity: Yes    Birth Control/ Protection: None   Other Topics Concern  . None   Social History Narrative    Family History:  Family History  Problem Relation Age of Onset  . Cancer Mother 40    colon  . Hearing loss Mother   . Hypertension Father   . Diabetes Father   . Birth defects Maternal Uncle     hole in his heart at birth  . Hearing loss Maternal Uncle   . Diabetes Paternal Aunt   . Diabetes Paternal Uncle   . Heart disease Maternal Grandmother   . Hearing loss Maternal Grandmother   . Diabetes Maternal Grandfather   . Diabetes Paternal Grandmother     Medications:  Prenatal vitamins,  No current facility-administered medications for this encounter.    Allergies: No Known Allergies  Review of Systems: - negative  Physical Exam: Last menstrual period 10/22/2013. GENERAL: Well-developed, well-nourished female in no acute distress.  LUNGS: Clear to auscultation bilaterally.  HEART: Regular rate and rhythm. ABDOMEN: Soft, nontender, nondistended, gravid. EFW 7.5 lbs EXTREMITIES: Nontender, no edema, 2+ distal pulses. Cervical Exam: Dilatation 3 cm   Effacement 80%   Station -3   Presentation: breech footling FHT:  Baseline rate 150 bpm   Variability moderate  Accelerations present   Decelerations none Contractions: Every 5 mins   Pertinent Labs/Studies: none  Assessment : Tracy Berger is a 23 y.o. G2P1001 at [redacted]w[redacted]d being admitted for cesarean section secondary to breech with SROM.    Plan: The risks of cesarean section discussed with the patient included but were  not limited to: bleeding which may require transfusion or reoperation; infection which may require antibiotics; injury to bowel, bladder, ureters or other surrounding organs; injury to the fetus; need for additional procedures including hysterectomy in the event of a life-threatening hemorrhage; placental abnormalities wth subsequent pregnancies, incisional problems, thromboembolic phenomenon and other postoperative/anesthesia complications. The patient concurred with the proposed plan, giving informed written consent for the procedure.   Patient has been NPO since 4pm and will remain NPO for procedure.  Preoperative prophylactic Ancef ordered on call to the OR.    [name] 07/20/2014, 8:32 PM

## 2014-07-20 NOTE — Op Note (Addendum)
Tracy Berger PROCEDURE DATE: 07/20/2014  PREOPERATIVE DIAGNOSIS: Intrauterine pregnancy at  [redacted]w[redacted]d weeks gestation; Breech position, SROM, labor, previous cesarean.  POSTOPERATIVE DIAGNOSIS: The same  PROCEDURE: Repeat Low Transverse Cesarean Section  SURGEON:  Dr. Loma Boston  ASSISTANT: Dr Berna Spare  INDICATIONS: Tracy Berger is a 23 y.o. G2P1001 at [redacted]w[redacted]d scheduled for cesarean section secondary to Breech position, SROM, labor, previous cesarean..  The risks of cesarean section discussed with the patient included but were not limited to: bleeding which may require transfusion or reoperation; infection which may require antibiotics; injury to bowel, bladder, ureters or other surrounding organs; injury to the fetus; need for additional procedures including hysterectomy in the event of a life-threatening hemorrhage; placental abnormalities wth subsequent pregnancies, incisional problems, thromboembolic phenomenon and other postoperative/anesthesia complications. The patient concurred with the proposed plan, giving informed written consent for the procedure.    FINDINGS:  Viable female infant in cephalic presentation.  Apgars 9 and 9, weight pending. Clear amniotic fluid.  Intact placenta, three vessel cord.  Didelphis uterus, fallopian tubes and ovaries bilaterally.  ANESTHESIA:    Spinal INTRAVENOUS FLUIDS: 2300 ml ESTIMATED BLOOD LOSS: 700 ml URINE OUTPUT:  400 ml SPECIMENS: Placenta sent to pathology COMPLICATIONS: None immediate  PROCEDURE IN DETAIL:  The patient received intravenous antibiotics and had sequential compression devices applied to her lower extremities while in the preoperative area.  She was then taken to the operating room where spinal anesthesia was administered and was found to be adequate. She was then placed in a dorsal supine position with a leftward tilt, and prepped and draped in a sterile manner.  A foley catheter was placed into her bladder and  attached to constant gravity, which drained clear fluid throughout.  After an adequate timeout was performed, a Pfannenstiel skin incision was made with scalpel and carried through to the underlying layer of fascia. The fascia was incised in the midline and this incision was extended bilaterally using the Mayo scissors. Kocher clamps were applied to the superior aspect of the fascial incision and the underlying rectus muscles were dissected off bluntly. A similar process was carried out on the inferior aspect of the facial incision. The rectus muscles were separated in the midline bluntly and the peritoneum was entered bluntly. An Alexis retractor was placed to aid in visualization of the uterus.  Attention was turned to the lower uterine segment where a transverse hysterotomy was made with a scalpel and extended bilaterally bluntly. The infant was successfully delivered, and cord was clamped and cut and infant was handed over to awaiting neonatology team. Uterine massage was then administered and the placenta delivered intact with three-vessel cord. The uterus was then cleared of clot and debris.  The uterus was examined and revealed two uterine cavities with the hysterotomy spanning both cavities.  The cavities were separated by a septum.  The hysterotomy was closed with 0 Vicryl in a running locked fashion, and an imbricating layer was also placed with a 0 Vicryl. Overall, excellent hemostasis was noted. The abdomen and the pelvis were cleared of all clot and debris and the Ubaldo Glassing was removed. Hemostasis was confirmed on all surfaces.  The fascia was then closed using 0 Vicryl in a running fashion.  The subcutaneous layer was reapproximated with plain gut and the skin was closed with 4-0 vicryl. The patient tolerated the procedure well. Sponge, lap, instrument and needle counts were correct x 2. She was taken to the recovery room in stable condition.    Edison Nasuti  [place], DO 07/20/2014 11:02 PM

## 2014-07-21 ENCOUNTER — Encounter (HOSPITAL_COMMUNITY): Payer: Self-pay | Admitting: *Deleted

## 2014-07-21 ENCOUNTER — Inpatient Hospital Stay (HOSPITAL_COMMUNITY): Admission: RE | Admit: 2014-07-21 | Payer: Medicaid Other | Source: Ambulatory Visit

## 2014-07-21 ENCOUNTER — Encounter: Payer: Medicaid Other | Admitting: Obstetrics and Gynecology

## 2014-07-21 DIAGNOSIS — Z98891 History of uterine scar from previous surgery: Secondary | ICD-10-CM

## 2014-07-21 LAB — CBC
HEMATOCRIT: 25.5 % — AB (ref 36.0–46.0)
HEMOGLOBIN: 8.5 g/dL — AB (ref 12.0–15.0)
MCH: 29.5 pg (ref 26.0–34.0)
MCHC: 33.3 g/dL (ref 30.0–36.0)
MCV: 88.5 fL (ref 78.0–100.0)
Platelets: 208 10*3/uL (ref 150–400)
RBC: 2.88 MIL/uL — ABNORMAL LOW (ref 3.87–5.11)
RDW: 14.2 % (ref 11.5–15.5)
WBC: 22.1 10*3/uL — ABNORMAL HIGH (ref 4.0–10.5)

## 2014-07-21 LAB — RPR

## 2014-07-21 MED ORDER — ZOLPIDEM TARTRATE 5 MG PO TABS: 5.0000 mg | ORAL_TABLET | Freq: Every evening | ORAL | Status: DC | PRN

## 2014-07-21 MED ORDER — ONDANSETRON HCL 4 MG/2ML IJ SOLN: 4.0000 mg | Freq: Three times a day (TID) | INTRAMUSCULAR | Status: DC | PRN

## 2014-07-21 MED ORDER — DIPHENHYDRAMINE HCL 50 MG/ML IJ SOLN
12.5000 mg | INTRAMUSCULAR | Status: DC | PRN
Start: 2014-07-21 — End: 2014-07-22
  Administered 2014-07-20: 12.5 mg via INTRAVENOUS

## 2014-07-21 MED ORDER — SENNOSIDES-DOCUSATE SODIUM 8.6-50 MG PO TABS
2.0000 | ORAL_TABLET | ORAL | Status: DC
  Administered 2014-07-21 – 2014-07-22 (×2): 2 via ORAL
  Filled 2014-07-21 (×2): qty 2

## 2014-07-21 MED ORDER — OXYCODONE-ACETAMINOPHEN 5-325 MG PO TABS: 2.0000 | ORAL_TABLET | ORAL | Status: DC | PRN

## 2014-07-21 MED ORDER — DIBUCAINE 1 % RE OINT: 1.0000 "application " | TOPICAL_OINTMENT | RECTAL | Status: DC | PRN

## 2014-07-21 MED ORDER — NALOXONE HCL 0.4 MG/ML IJ SOLN: 0.4000 mg | INTRAMUSCULAR | Status: DC | PRN

## 2014-07-21 MED ORDER — PRENATAL MULTIVITAMIN CH
1.0000 | ORAL_TABLET | Freq: Every day | ORAL | Status: DC
  Administered 2014-07-21 – 2014-07-22 (×2): 1 via ORAL
  Filled 2014-07-21 (×2): qty 1

## 2014-07-21 MED ORDER — LACTATED RINGERS IV SOLN: INTRAVENOUS | Status: DC

## 2014-07-21 MED ORDER — TETANUS-DIPHTH-ACELL PERTUSSIS 5-2.5-18.5 LF-MCG/0.5 IM SUSP: 0.5000 mL | Freq: Once | INTRAMUSCULAR | Status: DC

## 2014-07-21 MED ORDER — SIMETHICONE 80 MG PO CHEW: 80.0000 mg | CHEWABLE_TABLET | ORAL | Status: DC | PRN

## 2014-07-21 MED ORDER — SODIUM CHLORIDE 0.9 % IJ SOLN: 3.0000 mL | INTRAMUSCULAR | Status: DC | PRN

## 2014-07-21 MED ORDER — DIPHENHYDRAMINE HCL 25 MG PO CAPS
25.0000 mg | ORAL_CAPSULE | ORAL | Status: DC | PRN
  Filled 2014-07-21: qty 1

## 2014-07-21 MED ORDER — WITCH HAZEL-GLYCERIN EX PADS: 1.0000 "application " | MEDICATED_PAD | CUTANEOUS | Status: DC | PRN

## 2014-07-21 MED ORDER — IBUPROFEN 600 MG PO TABS: 600.0000 mg | ORAL_TABLET | Freq: Four times a day (QID) | ORAL | Status: DC | PRN

## 2014-07-21 MED ORDER — NALBUPHINE HCL 10 MG/ML IJ SOLN: 5.0000 mg | Freq: Once | INTRAMUSCULAR | Status: AC | PRN

## 2014-07-21 MED ORDER — SCOPOLAMINE 1 MG/3DAYS TD PT72
1.0000 | MEDICATED_PATCH | Freq: Once | TRANSDERMAL | Status: DC
  Filled 2014-07-21: qty 1

## 2014-07-21 MED ORDER — SIMETHICONE 80 MG PO CHEW
80.0000 mg | CHEWABLE_TABLET | Freq: Three times a day (TID) | ORAL | Status: DC
  Administered 2014-07-21 – 2014-07-22 (×4): 80 mg via ORAL
  Filled 2014-07-21 (×4): qty 1

## 2014-07-21 MED ORDER — LANOLIN HYDROUS EX OINT: 1.0000 "application " | TOPICAL_OINTMENT | CUTANEOUS | Status: DC | PRN

## 2014-07-21 MED ORDER — DIPHENHYDRAMINE HCL 25 MG PO CAPS
25.0000 mg | ORAL_CAPSULE | Freq: Four times a day (QID) | ORAL | Status: DC | PRN
Start: 2014-07-21 — End: 2014-07-22

## 2014-07-21 MED ORDER — MENTHOL 3 MG MT LOZG: 1.0000 | LOZENGE | OROMUCOSAL | Status: DC | PRN

## 2014-07-21 MED ORDER — NALBUPHINE HCL 10 MG/ML IJ SOLN: 5.0000 mg | INTRAMUSCULAR | Status: DC | PRN

## 2014-07-21 MED ORDER — SIMETHICONE 80 MG PO CHEW
80.0000 mg | CHEWABLE_TABLET | ORAL | Status: DC
  Administered 2014-07-21 – 2014-07-22 (×2): 80 mg via ORAL
  Filled 2014-07-21 (×2): qty 1

## 2014-07-21 MED ORDER — INFLUENZA VAC SPLIT QUAD 0.5 ML IM SUSY: 0.5000 mL | PREFILLED_SYRINGE | INTRAMUSCULAR | Status: DC

## 2014-07-21 MED ORDER — OXYCODONE-ACETAMINOPHEN 5-325 MG PO TABS
1.0000 | ORAL_TABLET | ORAL | Status: DC | PRN
  Administered 2014-07-22 (×3): 1 via ORAL
  Filled 2014-07-21 (×3): qty 1

## 2014-07-21 MED ORDER — ONDANSETRON HCL 4 MG PO TABS: 4.0000 mg | ORAL_TABLET | ORAL | Status: DC | PRN

## 2014-07-21 MED ORDER — OXYTOCIN 40 UNITS IN LACTATED RINGERS INFUSION - SIMPLE MED
62.5000 mL/h | INTRAVENOUS | Status: AC
Start: 2014-07-21 — End: 2014-07-21

## 2014-07-21 MED ORDER — ONDANSETRON HCL 4 MG/2ML IJ SOLN: 4.0000 mg | INTRAMUSCULAR | Status: DC | PRN

## 2014-07-21 MED ORDER — IBUPROFEN 600 MG PO TABS
600.0000 mg | ORAL_TABLET | Freq: Four times a day (QID) | ORAL | Status: DC
Start: 2014-07-21 — End: 2014-07-22
  Administered 2014-07-21 – 2014-07-22 (×6): 600 mg via ORAL
  Filled 2014-07-21 (×7): qty 1

## 2014-07-21 MED ORDER — NALOXONE HCL 1 MG/ML IJ SOLN
1.0000 ug/kg/h | INTRAMUSCULAR | Status: DC | PRN
  Filled 2014-07-21: qty 2

## 2014-07-21 NOTE — Lactation Note (Signed)
This note was copied from the chart of Girl Pami Farrelly. Lactation Consultation Note  Patient Name: Girl Renelda Kilian [id number] Date: 07/21/2014 Reason for consult: Initial assessment Baby 16 hours of life. Mom states baby is nursing well. When asked if baby latching deeply mom states yes. Asked mom if she would like assistance with latching, mom states no. Enc mom to nurse often and discussed supply and demand. Enc mom to call out for assistance as needed. [facility] brochure given, aware of OP/BFSG, community resources, and [facility] phone line assistance after D/C.  Maternal Data Does the patient have breastfeeding experience prior to this delivery?: Yes  Feeding Feeding Type: Breast Fed Length of feed: 15 min  LATCH Score/Interventions                      Lactation Tools Discussed/Used     Consult Status Consult Status: PRN    Inocente Salles 07/21/2014, 2:02 PM

## 2014-07-21 NOTE — Anesthesia Postprocedure Evaluation (Signed)
  Anesthesia Post-op Note  Patient: Tracy Berger  Procedure(s) Performed: Procedure(s): CESAREAN SECTION (N/A)  Patient Location: Mother/Baby  Anesthesia Type:Spinal  Level of Consciousness: awake, alert , oriented and patient cooperative  Airway and Oxygen Therapy: Patient Spontanous Breathing  Post-op Pain: mild  Post-op Assessment: Post-op Vital signs reviewed, Patient's Cardiovascular Status Stable, Respiratory Function Stable, Patent Airway, No headache, No backache, No residual numbness and No residual motor weakness  Post-op Vital Signs: Reviewed and stable  Last Vitals:  Filed Vitals:   07/21/14 0452  BP: 108/46  Pulse: 75  Temp: 37.2 C  Resp: 16    Complications: No apparent anesthesia complications

## 2014-07-21 NOTE — Progress Notes (Signed)
Subjective: Postpartum Day 1: Cesarean Delivery Patient reports incisional pain, tolerating PO and + flatus.  Foley still in  Objective: Vital signs in last 24 hours: Temp:  [98.3 F (36.8 C)-100.3 F (37.9 C)] 98.9 F (37.2 C) (12/23 0452) Pulse Rate:  [75-94] 75 (12/23 0452) Resp:  [16-38] 16 (12/23 0452) BP: (98-140)/(43-76) 108/46 mmHg (12/23 0452) SpO2:  [94 %-99 %] 96 % (12/23 0452)  Physical Exam:  General: alert, cooperative and no distress Lochia: appropriate Uterine Fundus: firm Incision: healing well, no significant drainage, no dehiscence, no significant erythema DVT Evaluation: No evidence of DVT seen on physical exam. Negative Homan's sign. No cords or calf tenderness. No significant calf/ankle edema.   Recent Labs  07/20/14 2020 07/21/14 0555  HGB 10.0* 8.5*  HCT 30.3* 25.5*    Assessment/Plan: Status post Cesarean section. Doing well postoperatively.  Continue current care.  Remove foley, SCDs when ambulatory.  Tracy Berger JEHIEL 07/21/2014, 8:05 AM

## 2014-07-22 ENCOUNTER — Encounter (HOSPITAL_COMMUNITY): Admission: RE | Payer: Self-pay | Source: Ambulatory Visit

## 2014-07-22 ENCOUNTER — Inpatient Hospital Stay (HOSPITAL_COMMUNITY)
Admission: RE | Admit: 2014-07-22 | Payer: Medicaid Other | Source: Ambulatory Visit | Admitting: Obstetrics and Gynecology

## 2014-07-22 SURGERY — Surgical Case
Anesthesia: Regional

## 2014-07-22 MED ORDER — IBUPROFEN 600 MG PO TABS: 600.0000 mg | ORAL_TABLET | Freq: Four times a day (QID) | ORAL | Status: DC

## 2014-07-22 MED ORDER — OXYCODONE-ACETAMINOPHEN 5-325 MG PO TABS: 1.0000 | ORAL_TABLET | ORAL | Status: DC | PRN

## 2014-07-22 NOTE — Discharge Summary (Signed)
Obstetric Discharge Summary Reason for Admission: onset of labor, rupture of membranes and breech presentation Prenatal Procedures: none Intrapartum Procedures: cesarean: low cervical, transverse Postpartum Procedures: none Complications-Operative and Postpartum: none HEMOGLOBIN  Date Value Ref Range Status  07/21/2014 8.5* 12.0 - 15.0 g/dL Final  04/07/2014 11.3* 12.2 - 16.2 g/dL Final   HCT  Date Value Ref Range Status  07/21/2014 25.5* 36.0 - 46.0 % Final   Truett Mainland, DO Physician Signed Obstetrics Op Note 07/20/2014 10:58 PM    Expand All Collapse All   Tracy Berger PROCEDURE DATE: 07/20/2014  PREOPERATIVE DIAGNOSIS: Intrauterine pregnancy at [redacted]w[redacted]d weeks gestation; Breech position, SROM, labor, previous cesarean.  POSTOPERATIVE DIAGNOSIS: The same  PROCEDURE: Repeat Low Transverse Cesarean Section  SURGEON: Dr. Loma Boston  ASSISTANT: Dr Berna Spare  INDICATIONS: Tracy Berger is a 23 y.o. G2P1001 at [redacted]w[redacted]d scheduled for cesarean section secondary to Breech position, SROM, labor, previous cesarean.. The risks of cesarean section discussed with the patient included but were not limited to: bleeding which may require transfusion or reoperation; infection which may require antibiotics; injury to bowel, bladder, ureters or other surrounding organs; injury to the fetus; need for additional procedures including hysterectomy in the event of a life-threatening hemorrhage; placental abnormalities wth subsequent pregnancies, incisional problems, thromboembolic phenomenon and other postoperative/anesthesia complications. The patient concurred with the proposed plan, giving informed written consent for the procedure.   FINDINGS: Viable female infant in cephalic presentation. Apgars 9 and 9, weight pending. Clear amniotic fluid. Intact placenta, three vessel cord. Bicornuate uterus, fallopian tubes and ovaries bilaterally.  ANESTHESIA: Spinal INTRAVENOUS  FLUIDS: 2300 ml ESTIMATED BLOOD LOSS: 700 ml URINE OUTPUT: 400 ml SPECIMENS: Placenta sent to pathology COMPLICATIONS: None immediate  PROCEDURE IN DETAIL: The patient received intravenous antibiotics and had sequential compression devices applied to her lower extremities while in the preoperative area. She was then taken to the operating room where spinal anesthesia was administered and was found to be adequate. She was then placed in a dorsal supine position with a leftward tilt, and prepped and draped in a sterile manner. A foley catheter was placed into her bladder and attached to constant gravity, which drained clear fluid throughout. After an adequate timeout was performed, a Pfannenstiel skin incision was made with scalpel and carried through to the underlying layer of fascia. The fascia was incised in the midline and this incision was extended bilaterally using the Mayo scissors. Kocher clamps were applied to the superior aspect of the fascial incision and the underlying rectus muscles were dissected off bluntly. A similar process was carried out on the inferior aspect of the facial incision. The rectus muscles were separated in the midline bluntly and the peritoneum was entered bluntly. An Alexis retractor was placed to aid in visualization of the uterus. Attention was turned to the lower uterine segment where a transverse hysterotomy was made with a scalpel and extended bilaterally bluntly. The infant was successfully delivered, and cord was clamped and cut and infant was handed over to awaiting neonatology team. Uterine massage was then administered and the placenta delivered intact with three-vessel cord. The uterus was then cleared of clot and debris. The uterus was examined and revealed two uterine cavities with the hysterotomy spanning both cavities. The cavities were separated by a septum and the uterus was heart shaped. The hysterotomy was closed with 0 Vicryl in a running locked  fashion, and an imbricating layer was also placed with a 0 Vicryl. Overall, excellent hemostasis was noted. The abdomen  and the pelvis were cleared of all clot and debris and the Ubaldo Glassing was removed. Hemostasis was confirmed on all surfaces. The fascia was then closed using 0 Vicryl in a running fashion. The subcutaneous layer was reapproximated with plain gut and the skin was closed with 4-0 vicryl. The patient tolerated the procedure well. Sponge, lap, instrument and needle counts were correct x 2. She was taken to the recovery room in stable condition.    Loma Boston, DO 07/20/2014 11:02 PM       Physical Exam:  General: alert, cooperative and no distress Lochia: appropriate Uterine Fundus: firm Incision: healing well, no dehiscence, had bloody drainage earlier resolved DVT Evaluation: No evidence of DVT seen on physical exam.  Discharge Diagnoses: Term Pregnancy-delivered  Discharge Information: Date: 07/22/2014 Activity: pelvic rest and no heavy lifting Diet: routine Medications: Ibuprofen and Percocet Condition: stable Instructions: refer to practice specific booklet and Cesarean section care after Discharge to: home Follow-up Information    Follow up with [facility] In 6 weeks.   Contact information:   [place] [id number] [phone]      Newborn Data: Live born female  Birth Weight: 7 lb 9.2 oz (3435 g) APGAR: 9, 9  Home with mother.  Tracy Berger 07/22/2014, 3:05 PM

## 2014-07-22 NOTE — Progress Notes (Signed)
Dr. Roselie Awkward at bedside site assessed states it's healing fine and orders given to place steri's. No new dressing ordered; leave open to air.

## 2014-07-22 NOTE — Progress Notes (Signed)
Pt still not seen by OB practice. Resident, Dr. Deniece Ree, called by RN again. Dr. Deniece Ree states will be by.

## 2014-07-22 NOTE — Progress Notes (Signed)
Patient still has not been seen this morning called resident phone Deniece Ree) phone answered by OR Nurse. OR nurse  informed Floor nurse that Deniece Ree was currently in C/S. RN asked if there was someone else to call OR nurse stated that now wasn't a good time to ask MD having trouble getting baby out. Floor RN will call back later.

## 2014-07-22 NOTE — Progress Notes (Signed)
Called Dr Deniece Ree again at 1447 after pt stated she still had not been seen. Per Dr. Deniece Ree Dr Roselie Awkward had seen her "more than an hour ago" and I should call him to find out the out come of the assessment. Called Dr Roselie Awkward at [phone] he states he has not been there yet but will be shortly.

## 2014-07-22 NOTE — Progress Notes (Signed)
Pt called out, states "my dressing is coming off". Upon assessing incision dressing, noted dressing to be saturated with serous sanguinous drainage. Faculty practice MD called and notified of this- order to apply 4x4 gauze to incision at this time, and will come to see pt soon.

## 2014-07-22 NOTE — Progress Notes (Signed)
MD has not been in to assess incision. Resident called and informed of situation asked if she would like saline gauze applied to site due to length of time taken to be assessed by MD. MD states dry is fine Dr Glo Herring should round soon.

## 2014-07-22 NOTE — Plan of Care (Signed)
Problem: Phase II Progression Outcomes Goal: Incision intact & without signs/symptoms of infection Outcome: Adequate for Discharge Slight opening of superficial layer. Assessed by MD. Dr. Roselie Awkward not worried about it healing ok to place steri's across entire incision.

## 2014-07-22 NOTE — Lactation Note (Signed)
This note was copied from the chart of Tracy Zehava Rubey. Lactation Consultation Note Wants early discharge. States BF going well. Noted space between pendulum breast. Hand expressed colostrum. Encouraged to rub on nipples. Has good everted nipples. Gave hand pump to post-pump to stimulate breast after BF to build up milk supply d/t Hx; low milk supply with her 6 yr. Old son. Mom stated she didn't formula feed until 2 months when he started loosing weight and Dr. Rockey Situ her to supplement then he didn't want her breast anymore. Baby sleeping at this time. Packed and ready to go waiting on MD. Parents state that baby has had a lot of pee's and poop's. Tech is going to update I&O. Explained the importance to parents of documenting I&O, reviewed  Expected output according to age in 37 and Me book. Reminded of OP Lydia services if needed and support groups.  Patient Name: Tracy Berger [id number] Date: 07/22/2014 Reason for consult: Follow-up assessment   Maternal Data    Feeding Feeding Type: Breast Milk  LATCH Score/Interventions       Type of Nipple: Everted at rest and after stimulation  Comfort (Breast/Nipple): Soft / non-tender     Intervention(s): Position options;Skin to skin;Support Pillows;Breastfeeding basics reviewed     Lactation Tools Discussed/Used Tools: Pump Breast pump type: Manual Pump Review: Setup, frequency, and cleaning;Milk Storage Initiated by:: Allayne Stack RN Date initiated:: 07/22/14   Consult Status Consult Status: Complete    [name] 07/22/2014, 3:32 PM

## 2014-07-22 NOTE — Discharge Instructions (Signed)

## 2014-07-23 ENCOUNTER — Encounter (HOSPITAL_COMMUNITY): Payer: Self-pay | Admitting: Family Medicine

## 2014-07-28 ENCOUNTER — Encounter: Payer: Self-pay | Admitting: Obstetrics and Gynecology

## 2014-07-28 ENCOUNTER — Ambulatory Visit (INDEPENDENT_AMBULATORY_CARE_PROVIDER_SITE_OTHER): Payer: Medicaid Other | Admitting: Obstetrics and Gynecology

## 2014-07-28 VITALS — BP 122/60 | Ht 64.0 in | Wt 230.0 lb

## 2014-07-28 DIAGNOSIS — Z9889 Other specified postprocedural states: Secondary | ICD-10-CM

## 2014-07-28 DIAGNOSIS — Z09 Encounter for follow-up examination after completed treatment for conditions other than malignant neoplasm: Secondary | ICD-10-CM

## 2014-07-28 NOTE — Progress Notes (Signed)
Tracy Berger is a 23 y.o. who presents for a postpartum visit. She is 1 week postpartum following a repeat low cervical transverse Cesarean section. I have fully reviewed the prenatal and intrapartum course. The delivery was at [redacted]w[redacted]d gestational weeks.  Anesthesia: spinal. Postpartum course has been notable for slight separation of 1.5 cm portion of incision on right edge.. Baby's course has been uneventful. Baby is feeding by breast. Bleeding: moderate lochia. Bowel function is normal. Bladder function is normal. Patient is not sexually active. Contraception method is abstinence. Postpartum depression screening: negative.  ROS: Complication with incision, otherwise negative.   Pt here today for incision check. Pt states that the incision doesn't seem to be closing on the right side at the end. Pt states that the incision is draining a little bit. Pt denies any redness or warmth to the incision.   Objective:     Filed Vitals:   07/28/14 1131  BP: 122/60   General:  alert, cooperative and no distress   Breasts:  negative  Lungs: clear to auscultation bilaterally  Heart:  regular rate and rhythm  Abdomen: Soft, nontender   Vulva: Not indicated  Vagina: Not indicated  Cervix:  Not indicated  Corpus: Not indicated      Rectal Exam: Not indicated        Assessment:    normal postpartum exam. With slight skin edge separation  Septate uterus. Plan:    1. Contraception: abstinence  Then nexplanon 2. Follow up in: 1 week or as needed. For incision re-check

## 2014-08-04 ENCOUNTER — Ambulatory Visit (INDEPENDENT_AMBULATORY_CARE_PROVIDER_SITE_OTHER): Payer: Medicaid Other | Admitting: Advanced Practice Midwife

## 2014-08-04 ENCOUNTER — Encounter: Payer: Self-pay | Admitting: Advanced Practice Midwife

## 2014-08-04 VITALS — BP 108/62 | Ht 64.0 in | Wt 226.0 lb

## 2014-08-04 DIAGNOSIS — Z9889 Other specified postprocedural states: Secondary | ICD-10-CM

## 2014-08-04 DIAGNOSIS — Z09 Encounter for follow-up examination after completed treatment for conditions other than malignant neoplasm: Secondary | ICD-10-CM

## 2014-08-04 NOTE — Progress Notes (Signed)
[facility] Visit  Patient name: Tracy Berger MRN [id number]  Date of birth: Sep 10, 1990  CC & HPI:  Tracy Berger is a 24 y.o. Caucasian female presenting today for Post op check:  RLTCS (breech) 12/22.  Seen a week ago for right side of incision coming apart.  It was steristripped and now is closed.  There is some extra granulation tissue (1cm) and a yeast rash around that area  Pertinent History Reviewed:  Medical & Surgical Hx:   Past Medical History  Diagnosis Date  . Gestational diabetes     with 1st pregnancy  . Didelphic uterus   . HOH (hard of hearing) 01/05/2014  . Supervision of other high-risk pregnancy(V23.89) 01/05/2014  . 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 Cesarean section    . Wisdom tooth extraction    . Cesarean section N/A 07/20/2014    Procedure: CESAREAN SECTION;  Surgeon: Truett Mainland, DO;  Location: [facility];  Service: Obstetrics;  Laterality: N/A;   Current outpatient prescriptions: ibuprofen (ADVIL,MOTRIN) 600 MG tablet, Take 1 tablet (600 mg total) by mouth every 6 (six) hours., Disp: 30 tablet, Rfl: 0;  prenatal vitamin w/FE, FA (PRENATAL 1 + 1) 27-1 MG TABS tablet, Take 1 tablet by mouth daily at 12 noon., Disp: 30 each, Rfl: 11 oxyCODONE-acetaminophen (PERCOCET/ROXICET) 5-325 MG per tablet, Take 1-2 tablets by mouth every 4 (four) hours as needed (for pain scale less than 7). (Patient not taking: Reported on 07/28/2014), Disp: 30 tablet, Rfl: 0 Social History: Reviewed -  reports that she has quit smoking. Her smoking use included Cigarettes. She smoked 0.00 packs per day. She has never used smokeless tobacco.  Objective Findings:  Vitals: BP 108/62 mmHg  Ht 5\' 4"  (1.626 m)  Wt 226 lb (102.513 kg)  BMI 38.77 kg/m2  Breastfeeding? Yes  Physical Examination: General appearance - alert, well appearing, and in no distress Mental  status - alert, oriented to person, place, and time Abdomen - Incision as described above.  Silver Nitrate applied to granulation tissue and gentian violet painted along iincision.   No results found for this or any previous visit (from the past 24 hour(s)).   Assessment & Plan:  A:   2 weeks SP CS, healing appropriately P:  Postpartum 2 weeks, Nexplanon 3 weeks  CRESENZO-DISHMAN,Charleton Deyoung CNM 08/04/2014 9:20 AM

## 2014-08-13 ENCOUNTER — Telehealth: Payer: Self-pay | Admitting: Advanced Practice Midwife

## 2014-08-13 NOTE — Telephone Encounter (Signed)
Pt states had c-section 07/20/2014, notice a "foul odor x 1 weeks with vaginal bleeding." Pt states no fever, or drainage noted. Appt scheduled for Monday, 08/16/2014 for evaluation. Pt informed if she begins to run fever, notice drainage, redness  heat, swelling at incision site to go to ER. Pt verbalized understanding.

## 2014-08-16 ENCOUNTER — Encounter: Payer: Medicaid Other | Admitting: Women's Health

## 2014-08-16 ENCOUNTER — Ambulatory Visit (INDEPENDENT_AMBULATORY_CARE_PROVIDER_SITE_OTHER): Payer: Medicaid Other | Admitting: Women's Health

## 2014-08-16 ENCOUNTER — Encounter: Payer: Self-pay | Admitting: Women's Health

## 2014-08-16 VITALS — BP 136/62 | Ht 64.0 in | Wt 226.0 lb

## 2014-08-16 DIAGNOSIS — N898 Other specified noninflammatory disorders of vagina: Secondary | ICD-10-CM

## 2014-08-16 DIAGNOSIS — L929 Granulomatous disorder of the skin and subcutaneous tissue, unspecified: Secondary | ICD-10-CM

## 2014-08-16 LAB — POCT WET PREP (WET MOUNT): Clue Cells Wet Prep Whiff POC: NEGATIVE

## 2014-08-16 NOTE — Progress Notes (Signed)
Patient ID: Tracy Berger, female   DOB: Mar 03, 1991, 24 y.o.   MRN: [id number]   [facility] Visit  Patient name: Tracy Berger MRN [id number]  Date of birth: 06-07-1991  CC & HPI:  Tracy Berger is a 24 y.o. Caucasian [id number] female almost 25wks s/p RLTCS presenting today for report of vaginal d/c w/ odor x 1 week, but has gotten much better over weekend and is nearly gone. No itching/irritation. Spot on incision that isn't healing, had silver nitrate applied couple of weeks ago. Breastfeeding  Pertinent History Reviewed:  Medical & Surgical Hx:   Past Medical History  Diagnosis Date  . Gestational diabetes     with 1st pregnancy  . Didelphic uterus   . HOH (hard of hearing) 01/05/2014  . Supervision of other high-risk pregnancy(V23.89) 01/05/2014  . 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 Cesarean section    . Wisdom tooth extraction    . Cesarean section N/A 07/20/2014    Procedure: CESAREAN SECTION;  Surgeon: Truett Mainland, DO;  Location: [facility];  Service: Obstetrics;  Laterality: N/A;   Medications: Reviewed & Updated - see associated section Social History: Reviewed -  reports that she has quit smoking. Her smoking use included Cigarettes. She has never used smokeless tobacco.  Objective Findings:  Vitals: BP 136/62 mmHg  Ht 5\' 4"  (1.626 m)  Wt 226 lb (102.513 kg)  BMI 38.77 kg/m2  Breastfeeding? Yes  Physical Examination: General appearance - alert, well appearing, and in no distress Abdomen - most of incision healing and well approximated. Small area ~1cm of granulation tissue on Rt edge of incision, applied silver nitrate Pelvic - small amount thin light pink nonodorous d/c  Results for orders placed or performed in visit on 08/16/14 (from the past 24 hour(s))  POCT Wet Prep Lenard Forth [place])   Collection Time: 08/16/14 10:32 AM  Result Value Ref  Range   Source Wet Prep POC vaginal    WBC, Wet Prep HPF POC few    Bacteria Wet Prep HPF POC none    BACTERIA WET PREP MORPHOLOGY POC     Clue Cells Wet Prep HPF POC None    Clue Cells Wet Prep Whiff POC Negative Whiff    Yeast Wet Prep HPF POC None    KOH Wet Prep POC     Trichomonas Wet Prep HPF POC none      Assessment & Plan:  A:   25wks s/p PLTCS, w/ persistent granulation tissue Rt side of incision  Normal d/c today P:  No sex until after nexplanon   F/U Thurs as scheduled for pp visit   Tawnya Crook CNM, [facility] 08/16/2014 10:33 AM

## 2014-08-19 ENCOUNTER — Ambulatory Visit (INDEPENDENT_AMBULATORY_CARE_PROVIDER_SITE_OTHER): Payer: Medicaid Other | Admitting: Advanced Practice Midwife

## 2014-08-19 ENCOUNTER — Encounter: Payer: Self-pay | Admitting: Advanced Practice Midwife

## 2014-08-19 NOTE — Progress Notes (Signed)
  Tracy Berger is a 24 y.o. who presents for a postpartum visit. She is 4 weeks postpartum following a low cervical transverse Cesarean section. I have fully reviewed the prenatal and intrapartum course. The delivery was at 38.5 gestational weeks d/t labor/breech Anesthesia: spinal. Postpartum course has been uneventful. Baby's course has been uneventful. Baby is feeding by breast and some bottle. . Bleeding: no bleeding. Bowel function is normal. Bladder function is normal. Patient is not sexually active. Contraception method is abstinence. Postpartum depression screening: negative. Still suffering from some anxiety and plans to seek therapy.  Wants referral.    Current outpatient prescriptions:  .  prenatal vitamin w/FE, FA (PRENATAL 1 + 1) 27-1 MG TABS tablet, Take 1 tablet by mouth daily at 12 noon., Disp: 30 each, Rfl: 11  Review of Systems   Constitutional: Negative for fever and chills Eyes: Negative for visual disturbances Respiratory: Negative for shortness of breath, dyspnea Cardiovascular: Negative for chest pain or palpitations  Gastrointestinal: Negative for vomiting, diarrhea and constipation Genitourinary: Negative for dysuria and urgency Musculoskeletal: Negative for back pain, joint pain, myalgias  Neurological: Negative for dizziness and headaches   Objective:     Filed Vitals:   08/19/14 1011  BP: 120/68   General:  alert, cooperative and no distress   Breasts:  negative  Lungs: clear to auscultation bilaterally  Heart:  regular rate and rhythm  Abdomen: Soft, nontender, incision well healed   Vulva:  normal  Vagina: normal vagina  Cervix:  closed  Corpus: Well involuted     Rectal Exam: no hemorrhoids        Assessment:    normal postpartum exam.  Plan:    1. Contraception: Nexplanon 2. Follow up as scheduled for Nexplanon.  Pt knows to remain abstinate until then 3.  [facility] referral

## 2014-08-25 ENCOUNTER — Encounter: Payer: Medicaid Other | Admitting: Women's Health

## 2014-08-27 ENCOUNTER — Encounter: Payer: Self-pay | Admitting: Obstetrics & Gynecology

## 2014-08-27 ENCOUNTER — Ambulatory Visit (INDEPENDENT_AMBULATORY_CARE_PROVIDER_SITE_OTHER): Payer: Medicaid Other | Admitting: Obstetrics & Gynecology

## 2014-08-27 VITALS — BP 110/60 | Wt 227.0 lb

## 2014-08-27 DIAGNOSIS — Z3049 Encounter for surveillance of other contraceptives: Secondary | ICD-10-CM

## 2014-08-27 DIAGNOSIS — Z3202 Encounter for pregnancy test, result negative: Secondary | ICD-10-CM

## 2014-08-27 DIAGNOSIS — Z3046 Encounter for surveillance of implantable subdermal contraceptive: Secondary | ICD-10-CM

## 2014-08-27 LAB — POCT URINE PREGNANCY: Preg Test, Ur: NEGATIVE

## 2014-08-27 NOTE — Progress Notes (Signed)
Patient ID: Tracy Berger, female   DOB: Dec 23, 1990, 24 y.o.   MRN: [id number] Pt presented desiring a nexplanon being placed, has had one previously  Left arm prepped 1% lidocaine injected SQ Small incision made nexplanon device used and placed without difficulty Palpable after placement Steri strips placed and dressed in sterile manner  Follow up prn

## 2014-12-10 ENCOUNTER — Encounter: Payer: Self-pay | Admitting: Gastroenterology

## 2015-02-14 ENCOUNTER — Ambulatory Visit: Payer: Medicaid Other | Admitting: Gastroenterology

## 2015-04-21 ENCOUNTER — Encounter: Payer: Self-pay | Admitting: Gastroenterology

## 2015-04-21 ENCOUNTER — Ambulatory Visit (INDEPENDENT_AMBULATORY_CARE_PROVIDER_SITE_OTHER): Payer: Medicaid Other | Admitting: Gastroenterology

## 2015-04-21 VITALS — BP 134/70 | HR 80 | Ht 64.0 in | Wt 228.4 lb

## 2015-04-21 DIAGNOSIS — Z800 Family history of malignant neoplasm of digestive organs: Secondary | ICD-10-CM | POA: Diagnosis not present

## 2015-04-21 MED ORDER — NA SULFATE-K SULFATE-MG SULF 17.5-3.13-1.6 GM/177ML PO SOLN: ORAL | Status: DC

## 2015-04-21 NOTE — Patient Instructions (Signed)
You have been scheduled for a colonoscopy. Please follow written instructions given to you at your visit today.  Please pick up your prep supplies at the pharmacy within the next 1-3 days. If you use inhalers (even only as needed), please bring them with you on the day of your procedure. Your physician has requested that you go to www.startemmi.com and enter the access code given to you at your visit today. This web site gives a general overview about your procedure. However, you should still follow specific instructions given to you by our office regarding your preparation for the procedure.  Thank you for choosing me and [facility].  Malcolm T. Stark, Jr., MD., FACG  

## 2015-04-21 NOTE — Progress Notes (Signed)
    History of Present Illness: This is a 24 year old female referred by Leonard Downing, * for the evaluation of a family history of colon cancer. Colonoscopy in 10/2008 was normal. A 5 year screening colonoscopy was recommended. Her mother developed colon cancer in her early 2s and she has a maternal aunt with colon cancer. Pt reports that no genetic syndrome was diagnosed. No other family members with colon cancer. She has a history of GERD but currently has no GI symptoms. Denies weight loss, abdominal pain, constipation, diarrhea, change in stool caliber, melena, hematochezia, nausea, vomiting, dysphagia, reflux symptoms, chest pain.  Review of Systems: Pertinent positive and negative review of systems were noted in the above HPI section. All other review of systems were otherwise negative.  Current Medications, Allergies, Past Medical History, Past Surgical History, Family History and Social History were reviewed in [employer] record.  Physical Exam: General: Well developed, well nourished, no acute distress Head: Normocephalic and atraumatic Eyes:  sclerae anicteric, EOMI Ears: Normal auditory acuity Mouth: No deformity or lesions Neck: Supple, no masses or thyromegaly Lungs: Clear throughout to auscultation Heart: Regular rate and rhythm; no murmurs, rubs or bruits Abdomen: Soft, non tender and non distended. No masses, hepatosplenomegaly or hernias noted. Normal Bowel sounds Rectal: Deferred to colonoscopy Musculoskeletal: Symmetrical with no gross deformities  Skin: No lesions on visible extremities Pulses:  Normal pulses noted Extremities: No clubbing, cyanosis, edema or deformities noted Neurological: Alert oriented x 4, grossly nonfocal Cervical Nodes:  No significant cervical adenopathy Inguinal Nodes: No significant inguinal adenopathy Psychological:  Alert and cooperative. Normal mood and affect  Assessment and Recommendations:  1. Family  history of colon cancer in mother in early 67s and maternal aunt. She is overdue for her 5 year interval screening colonoscopy. Schedule colonoscopy. The risks (including bleeding, perforation, infection, missed lesions, medication reactions and possible hospitalization or surgery if complications occur), benefits, and alternatives to colonoscopy with possible biopsy and possible polypectomy were discussed with the patient and they consent to proceed.   2. History of GERD. Currently asymptomatic.  cc: Leonard Downing, MD [address] [place], [place] [address]

## 2015-06-27 ENCOUNTER — Telehealth: Payer: Self-pay | Admitting: Gastroenterology

## 2015-06-27 ENCOUNTER — Encounter: Payer: Medicaid Other | Admitting: Gastroenterology

## 2015-06-27 NOTE — Telephone Encounter (Signed)
Per Dr. Fuller Plan, no charge.

## 2016-06-28 ENCOUNTER — Ambulatory Visit (INDEPENDENT_AMBULATORY_CARE_PROVIDER_SITE_OTHER): Payer: Medicaid Other | Admitting: Otolaryngology

## 2016-06-28 DIAGNOSIS — H8003 Otosclerosis involving oval window, nonobliterative, bilateral: Secondary | ICD-10-CM | POA: Diagnosis not present

## 2016-06-28 DIAGNOSIS — H900 Conductive hearing loss, bilateral: Secondary | ICD-10-CM | POA: Diagnosis not present

## 2016-07-30 HISTORY — PX: STAPEDECTOMY: SHX2435

## 2016-08-27 ENCOUNTER — Ambulatory Visit (INDEPENDENT_AMBULATORY_CARE_PROVIDER_SITE_OTHER): Payer: Medicaid Other | Admitting: Otolaryngology

## 2016-09-06 ENCOUNTER — Ambulatory Visit (INDEPENDENT_AMBULATORY_CARE_PROVIDER_SITE_OTHER): Payer: Medicaid Other | Admitting: Otolaryngology

## 2016-09-06 DIAGNOSIS — H906 Mixed conductive and sensorineural hearing loss, bilateral: Secondary | ICD-10-CM | POA: Diagnosis not present

## 2016-09-06 DIAGNOSIS — H8021 Cochlear otosclerosis, right ear: Secondary | ICD-10-CM | POA: Diagnosis not present

## 2016-09-10 ENCOUNTER — Ambulatory Visit (INDEPENDENT_AMBULATORY_CARE_PROVIDER_SITE_OTHER): Payer: Medicaid Other | Admitting: Otolaryngology

## 2016-09-10 DIAGNOSIS — H8001 Otosclerosis involving oval window, nonobliterative, right ear: Secondary | ICD-10-CM

## 2016-09-10 DIAGNOSIS — H906 Mixed conductive and sensorineural hearing loss, bilateral: Secondary | ICD-10-CM | POA: Diagnosis not present

## 2016-09-25 ENCOUNTER — Encounter (HOSPITAL_COMMUNITY): Payer: Self-pay | Admitting: Emergency Medicine

## 2016-09-25 ENCOUNTER — Ambulatory Visit (HOSPITAL_COMMUNITY)
Admission: EM | Admit: 2016-09-25 | Discharge: 2016-09-25 | Disposition: A | Discharge status: Home / Self Care | Payer: Medicaid Other | Attending: Family Medicine | Admitting: Family Medicine

## 2016-09-25 DIAGNOSIS — J209 Acute bronchitis, unspecified: Secondary | ICD-10-CM | POA: Diagnosis not present

## 2016-09-25 DIAGNOSIS — R05 Cough: Secondary | ICD-10-CM

## 2016-09-25 DIAGNOSIS — R059 Cough, unspecified: Secondary | ICD-10-CM

## 2016-09-25 MED ORDER — AZITHROMYCIN 250 MG PO TABS: 250.0000 mg | ORAL_TABLET | Freq: Every day | ORAL | 0 refills | Status: DC

## 2016-09-25 MED ORDER — METHYLPREDNISOLONE ACETATE 80 MG/ML IJ SUSP
INTRAMUSCULAR | Status: AC
  Filled 2016-09-25: qty 1

## 2016-09-25 MED ORDER — METHYLPREDNISOLONE ACETATE 80 MG/ML IJ SUSP
80.0000 mg | Freq: Once | INTRAMUSCULAR | Status: AC
  Administered 2016-09-25: 80 mg via INTRAMUSCULAR

## 2016-09-25 MED ORDER — BENZONATATE 100 MG PO CAPS: 200.0000 mg | ORAL_CAPSULE | Freq: Three times a day (TID) | ORAL | 0 refills | Status: DC | PRN

## 2016-09-25 NOTE — ED Provider Notes (Signed)
CSN: [id number]     Arrival date & time 09/25/16  1121 History   First MD Initiated Contact with Patient 09/25/16 1225     Chief Complaint  Patient presents with  . Cough   (Consider location/radiation/quality/duration/timing/severity/associated sxs/prior Treatment) Patient c/o cough and uri sx's for over a week.  She states she thinks she had flu last week and now she is having violent coughing spells but no fever.   The history is provided by the patient.  Cough  Cough characteristics:  Productive Sputum characteristics:  White Severity:  Moderate Onset quality:  Sudden Duration:  2 weeks Timing:  Constant Chronicity:  New Smoker: no   Context: upper respiratory infection and weather changes   Relieved by:  Nothing Worsened by:  Nothing Ineffective treatments:  None tried   Past Medical History:  Diagnosis Date  . Didelphic uterus   . 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 Gestational diabetes    with 1st pregnancy  . HOH (hard of hearing) 01/05/2014  . Supervision of other high-risk pregnancy(V23.89) 01/05/2014   Past Surgical History:  Procedure Laterality Date  . CESAREAN SECTION    . CESAREAN SECTION N/A 07/20/2014   Procedure: CESAREAN SECTION;  Surgeon: Truett Mainland, DO;  Location: [facility];  Service: Obstetrics;  Laterality: N/A;  . WISDOM TOOTH EXTRACTION     Family History  Problem Relation Age of Onset  . Cancer Mother 53    colon  . Hearing loss Mother   . Colon cancer Mother   . Hypertension Father   . Diabetes Father   . Birth defects Maternal Uncle     hole in his heart at birth  . Hearing loss Maternal Uncle   . Heart disease Maternal Grandmother   . Hearing loss Maternal Grandmother   . Diabetes Maternal Grandfather   . Diabetes Paternal Grandmother   . Diabetes Paternal Aunt   . Diabetes Paternal Uncle    Social History  Substance Use Topics  . Smoking status: Current Some Day Smoker     Types: Cigarettes  . Smokeless tobacco: Never Used  . Alcohol use No   OB History    Gravida Para Term Preterm AB Living   2 2 2     1    SAB TAB Ectopic Multiple Live Births         0 1     Review of Systems  Constitutional: Positive for fatigue.  HENT: Negative.   Eyes: Negative.   Respiratory: Positive for cough.   Cardiovascular: Negative.   Gastrointestinal: Negative.   Endocrine: Negative.   Genitourinary: Negative.   Musculoskeletal: Negative.   Allergic/Immunologic: Negative.   Neurological: Negative.   Hematological: Negative.   Psychiatric/Behavioral: Negative.     Allergies  Patient has no known allergies.  Home Medications   Prior to Admission medications   Medication Sig Start Date End Date Taking? Authorizing Provider  escitalopram (LEXAPRO) 5 MG tablet Take 5 mg by mouth daily.   Yes Historical Provider, MD  ibuprofen (ADVIL,MOTRIN) 400 MG tablet Take 400 mg by mouth every 6 (six) hours as needed.   Yes Historical Provider, MD  azithromycin (ZITHROMAX) 250 MG tablet Take 1 tablet (250 mg total) by mouth daily. Take first 2 tablets together, then 1 every day until finished. 09/25/16   Lysbeth Penner, FNP  benzonatate (TESSALON) 100 MG capsule Take 2 capsules (200 mg total) by mouth 3 (  three) times daily as needed for cough. 09/25/16   Lysbeth Penner, FNP  Na Sulfate-K Sulfate-Mg Sulf SOLN SUPREP use as directed 04/21/15   Ladene Artist, MD   Meds Ordered and Administered this Visit   Medications  methylPREDNISolone acetate (DEPO-MEDROL) injection 80 mg (not administered)    BP 93/56 (BP Location: Right Arm)   Pulse 86   Temp 98.5 F (36.9 C) (Oral)   LMP 09/18/2016 (Approximate)   SpO2 98%  No data found.   Physical Exam  Constitutional: She is oriented to person, place, and time. She appears well-developed and well-nourished.  HENT:  Head: Normocephalic and atraumatic.  Right Ear: External ear normal.  Left Ear: External ear normal.   Mouth/Throat: Oropharynx is clear and moist.  Eyes: Conjunctivae and EOM are normal. Pupils are equal, round, and reactive to light.  Neck: Normal range of motion. Neck supple.  Cardiovascular: Normal rate, regular rhythm and normal heart sounds.   Pulmonary/Chest: Effort normal and breath sounds normal.  Abdominal: Soft. Bowel sounds are normal.  Neurological: She is alert and oriented to person, place, and time.  Nursing note and vitals reviewed.   Urgent Care Course     Procedures (including critical care time)  Labs Review Labs Reviewed - No data to display  Imaging Review No results found.   Visual Acuity Review  Right Eye Distance:   Left Eye Distance:   Bilateral Distance:    Right Eye Near:   Left Eye Near:    Bilateral Near:         MDM   1. Acute bronchitis, unspecified organism   2. Cough    Depomedrol 80mg  IM Tessalon Perles 200mg  one po tid prn Zpak  Push po fluids, rest, tylenol and motrin otc prn as directed for fever, arthralgias, and myalgias.  Follow up prn if sx's continue or persist.    Lysbeth Penner, FNP 09/25/16 [place], [place] 09/25/16 [phone]

## 2016-09-25 NOTE — ED Triage Notes (Addendum)
Pt has had a cough for over a week.  She states the cough is deep in her chest and she has had a mild fever of 99.9.  She is also complaining of some back pain due to the cough.  Pt is scheduled for surgery on Friday and was hoping to get this taken care of so she doesn't have to cancel.

## 2016-10-18 ENCOUNTER — Ambulatory Visit (INDEPENDENT_AMBULATORY_CARE_PROVIDER_SITE_OTHER): Payer: Medicaid Other | Admitting: Otolaryngology

## 2016-10-30 ENCOUNTER — Encounter: Payer: Self-pay | Admitting: Gastroenterology

## 2016-12-13 ENCOUNTER — Ambulatory Visit (AMBULATORY_SURGERY_CENTER): Payer: Self-pay

## 2016-12-13 VITALS — Ht 64.0 in | Wt 233.4 lb

## 2016-12-13 DIAGNOSIS — Z1211 Encounter for screening for malignant neoplasm of colon: Secondary | ICD-10-CM

## 2016-12-13 MED ORDER — NA SULFATE-K SULFATE-MG SULF 17.5-3.13-1.6 GM/177ML PO SOLN: 1.0000 | Freq: Once | ORAL | 0 refills | Status: AC

## 2016-12-13 NOTE — Progress Notes (Signed)
Denies allergies to eggs or soy products. Denies complication of anesthesia or sedation. Denies use of weight loss medication. Denies use of O2.   Emmi instructions given for colonoscopy.  

## 2016-12-26 ENCOUNTER — Encounter: Payer: Self-pay | Admitting: Gastroenterology

## 2016-12-26 ENCOUNTER — Ambulatory Visit (AMBULATORY_SURGERY_CENTER): Payer: Medicaid Other | Admitting: Gastroenterology

## 2016-12-26 VITALS — BP 100/50 | HR 70 | Temp 98.4°F | Resp 18 | Ht 64.0 in | Wt 228.0 lb

## 2016-12-26 DIAGNOSIS — Z1212 Encounter for screening for malignant neoplasm of rectum: Secondary | ICD-10-CM

## 2016-12-26 DIAGNOSIS — Z1211 Encounter for screening for malignant neoplasm of colon: Secondary | ICD-10-CM | POA: Diagnosis not present

## 2016-12-26 DIAGNOSIS — D120 Benign neoplasm of cecum: Secondary | ICD-10-CM

## 2016-12-26 MED ORDER — SODIUM CHLORIDE 0.9 % IV SOLN: 500.0000 mL | INTRAVENOUS | Status: DC

## 2016-12-26 NOTE — Patient Instructions (Signed)
YOU HAD AN ENDOSCOPIC PROCEDURE TODAY AT THE [facility]:   Refer to the procedure report that was given to you for any specific questions about what was found during the examination.  If the procedure report does not answer your questions, please call your gastroenterologist to clarify.  If you requested that your care partner not be given the details of your procedure findings, then the procedure report has been included in a sealed envelope for you to review at your convenience later.  YOU SHOULD EXPECT: Some feelings of bloating in the abdomen. Passage of more gas than usual.  Walking can help get rid of the air that was put into your GI tract during the procedure and reduce the bloating. If you had a lower endoscopy (such as a colonoscopy or flexible sigmoidoscopy) you may notice spotting of blood in your stool or on the toilet paper. If you underwent a bowel prep for your procedure, you may not have a normal bowel movement for a few days.  Please Note:  You might notice some irritation and congestion in your nose or some drainage.  This is from the oxygen used during your procedure.  There is no need for concern and it should clear up in a day or so.  SYMPTOMS TO REPORT IMMEDIATELY:   Following lower endoscopy (colonoscopy or flexible sigmoidoscopy):  Excessive amounts of blood in the stool  Significant tenderness or worsening of abdominal pains  Swelling of the abdomen that is new, acute  Fever of 100F or higher   For urgent or emergent issues, a gastroenterologist can be reached at any hour by calling [phone].   DIET:  We do recommend a small meal at first, but then you may proceed to your regular diet.  Drink plenty of fluids but you should avoid alcoholic beverages for 24 hours.  ACTIVITY:  You should plan to take it easy for the rest of today and you should NOT DRIVE or use heavy machinery until tomorrow (because of the sedation medicines used during the test).     FOLLOW UP: Our staff will call the number listed on your records the next business day following your procedure to check on you and address any questions or concerns that you may have regarding the information given to you following your procedure. If we do not reach you, we will leave a message.  However, if you are feeling well and you are not experiencing any problems, there is no need to return our call.  We will assume that you have returned to your regular daily activities without incident.  If any biopsies were taken you will be contacted by phone or by letter within the next 1-3 weeks.  Please call us at [phone] if you have not heard about the biopsies in 3 weeks.    SIGNATURES/CONFIDENTIALITY: You and/or your care partner have signed paperwork which will be entered into your electronic medical record.  These signatures attest to the fact that that the information above on your After Visit Summary has been reviewed and is understood.  Full responsibility of the confidentiality of this discharge information lies with you and/or your care-partner.  Read all of the handouts given to you by your recovery room nurse. 

## 2016-12-26 NOTE — Progress Notes (Signed)
Pt's states no medical or surgical changes since previsit or office visit. 

## 2016-12-26 NOTE — Progress Notes (Signed)
A and O x3. Report to RN. Tolerated MAC anesthesia well.

## 2016-12-26 NOTE — Progress Notes (Signed)
Called to room to assist during endoscopic procedure.  Patient ID and intended procedure confirmed with present staff. Received instructions for my participation in the procedure from the performing physician.  

## 2016-12-26 NOTE — Op Note (Addendum)
[facility] Patient Name: Tracy Berger Procedure Date: 12/26/2016 10:16 AM MRN: [id number] Endoscopist: Ladene Artist , MD Age: 26 Referring MD:  Date of Birth: 05/22/1991 Gender: Female Account #: [id number] Procedure:                Colonoscopy Indications:              Screening in patient at increased risk: Family                            history of 1st-degree relative with colorectal                            cancer before age 51 years, Colon cancer screening                            in patient at increased risk: Family history of                            colorectal cancer in one 2nd degree relative Medicines:                Monitored Anesthesia Care Procedure:                Pre-Anesthesia Assessment:                           - Prior to the procedure, a History and Physical                            was performed, and patient medications and                            allergies were reviewed. The patient's tolerance of                            previous anesthesia was also reviewed. The risks                            and benefits of the procedure and the sedation                            options and risks were discussed with the patient.                            All questions were answered, and informed consent                            was obtained. Prior Anticoagulants: The patient has                            taken no previous anticoagulant or antiplatelet                            agents. ASA Grade Assessment: II - A patient with  mild systemic disease. After reviewing the risks                            and benefits, the patient was deemed in                            satisfactory condition to undergo the procedure.                           After obtaining informed consent, the colonoscope                            was passed under direct vision. Throughout the                            procedure, the  patient's blood pressure, pulse, and                            oxygen saturations were monitored continuously. The                            Colonoscope was introduced through the anus and                            advanced to the the cecum, identified by                            appendiceal orifice and ileocecal valve. The                            ileocecal valve, appendiceal orifice, and rectum                            were photographed. The quality of the bowel                            preparation was good. The colonoscopy was performed                            without difficulty. The patient tolerated the                            procedure well. Scope In: 10:21:45 AM Scope Out: 10:30:34 AM Scope Withdrawal Time: 0 hours 7 minutes 22 seconds  Total Procedure Duration: 0 hours 8 minutes 49 seconds  Findings:                 The perianal and digital rectal examinations were                            normal.                           A 6 mm polyp was found in the cecum. The polyp was  sessile. The polyp was removed with a cold snare.                            Resection and retrieval were complete.                           The exam was otherwise without abnormality on                            direct and retroflexion views. Complications:            No immediate complications. Estimated blood loss:                            None. Estimated Blood Loss:     Estimated blood loss: none. Impression:               - One 6 mm polyp in the cecum, removed with a cold                            snare. Resected and retrieved.                           - The examination was otherwise normal on direct                            and retroflexion views. Recommendation:           - Repeat colonoscopy in 5 years for                            surveillance/screening.                           - Patient has a contact number available for                             emergencies. The signs and symptoms of potential                            delayed complications were discussed with the                            patient. Return to normal activities tomorrow.                            Written discharge instructions were provided to the                            patient.                           - Resume previous diet.                           - Continue present medications.                           -  Await pathology results. Ladene Artist, MD 12/26/2016 10:34:46 AM This report has been signed electronically.

## 2016-12-27 ENCOUNTER — Telehealth: Payer: Self-pay | Admitting: *Deleted

## 2016-12-27 NOTE — Telephone Encounter (Signed)
  Follow up Call-  Call back number 12/26/2016  Post procedure Call Back phone  # [phone]  Permission to leave phone message Yes  Some recent data might be hidden     Patient questions:  Do you have a fever, pain , or abdominal swelling? No. Pain Score  0 *  Have you tolerated food without any problems? Yes.    Have you been able to return to your normal activities? Yes.    Do you have any questions about your discharge instructions: Diet   No. Medications  No. Follow up visit  No.  Do you have questions or concerns about your Care? No.  Actions: * If pain score is 4 or above: No action needed, pain <4.

## 2017-01-10 ENCOUNTER — Encounter: Payer: Self-pay | Admitting: Gastroenterology

## 2017-01-14 ENCOUNTER — Ambulatory Visit (INDEPENDENT_AMBULATORY_CARE_PROVIDER_SITE_OTHER): Payer: Medicaid Other | Admitting: Otolaryngology

## 2017-01-14 DIAGNOSIS — H906 Mixed conductive and sensorineural hearing loss, bilateral: Secondary | ICD-10-CM

## 2017-01-14 DIAGNOSIS — H8003 Otosclerosis involving oval window, nonobliterative, bilateral: Secondary | ICD-10-CM

## 2017-04-22 ENCOUNTER — Ambulatory Visit (INDEPENDENT_AMBULATORY_CARE_PROVIDER_SITE_OTHER): Payer: Self-pay | Admitting: Otolaryngology

## 2018-02-25 ENCOUNTER — Ambulatory Visit: Payer: Medicaid Other | Admitting: Advanced Practice Midwife

## 2018-02-25 ENCOUNTER — Encounter (INDEPENDENT_AMBULATORY_CARE_PROVIDER_SITE_OTHER): Payer: Self-pay

## 2018-02-25 ENCOUNTER — Encounter: Payer: Self-pay | Admitting: Advanced Practice Midwife

## 2018-02-25 VITALS — BP 119/64 | HR 79 | Ht 64.0 in | Wt 194.0 lb

## 2018-02-25 DIAGNOSIS — Z3046 Encounter for surveillance of implantable subdermal contraceptive: Secondary | ICD-10-CM

## 2018-02-25 NOTE — Progress Notes (Signed)
HPI:  Tracy Berger 27 y.o. here for Nexplanon removal.  Her future plans for birth control:  Husband had vasectomy.  .  Past Medical History: Past Medical History:  Diagnosis Date  . Allergy   . Anxiety   . Asthma   . Depression   . Didelphic uterus   . 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 GERD (gastroesophageal reflux disease)   . Gestational diabetes    with 1st pregnancy  . HOH (hard of hearing) 01/05/2014  . Supervision of other high-risk pregnancy(V23.89) 01/05/2014    Past Surgical History: Past Surgical History:  Procedure Laterality Date  . CESAREAN SECTION    . CESAREAN SECTION N/A 07/20/2014   Procedure: CESAREAN SECTION;  Surgeon: Truett Mainland, DO;  Location: [facility];  Service: Obstetrics;  Laterality: N/A;  . STAPEDECTOMY Left 2018  . WISDOM TOOTH EXTRACTION      Family History: Family History  Problem Relation Age of Onset  . Cancer Mother 29       colon  . Hearing loss Mother   . Colon cancer Mother   . Hypertension Father   . Diabetes Father   . Birth defects Maternal Uncle        hole in his heart at birth  . Hearing loss Maternal Uncle   . Heart disease Maternal Grandmother   . Hearing loss Maternal Grandmother   . Diabetes Maternal Grandfather   . Diabetes Paternal Grandmother   . Diabetes Paternal Aunt   . Diabetes Paternal Uncle   . Esophageal cancer Neg Hx     Social History: Social History   Tobacco Use  . Smoking status: Current Some Day Smoker    Types: Cigarettes  . Smokeless tobacco: Never Used  Substance Use Topics  . Alcohol use: Yes    Comment: occasional  . 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  Signed copy in the chart.  Appropriate time out taken. Implanon site identified.  Area prepped in usual sterile fashon. One cc of 1%  lidocaine was used to anesthetize the area at the distal end of the implant. A small stab incision was made right beside the implant on the distal portion.  The Nexplanon rod was grasped using hemostats and removed without difficulty.  There was less than 3 cc blood loss. There were no complications.  A small amount of antibiotic ointment and steri-strips were applied over the small incision.  A pressure bandage was applied to reduce any bruising.  The patient tolerated the procedure well and was given post procedure instructions.

## 2018-08-12 ENCOUNTER — Encounter: Payer: Self-pay | Admitting: Orthopaedic Surgery

## 2020-07-14 ENCOUNTER — Other Ambulatory Visit: Payer: Self-pay

## 2020-07-14 ENCOUNTER — Ambulatory Visit
Admission: EM | Admit: 2020-07-14 | Discharge: 2020-07-14 | Disposition: A | Discharge status: Home / Self Care | Payer: Medicaid Other | Attending: Internal Medicine | Admitting: Internal Medicine

## 2020-07-14 DIAGNOSIS — M791 Myalgia, unspecified site: Secondary | ICD-10-CM | POA: Diagnosis not present

## 2020-07-14 DIAGNOSIS — F17210 Nicotine dependence, cigarettes, uncomplicated: Secondary | ICD-10-CM | POA: Diagnosis not present

## 2020-07-14 DIAGNOSIS — J45909 Unspecified asthma, uncomplicated: Secondary | ICD-10-CM | POA: Insufficient documentation

## 2020-07-14 DIAGNOSIS — Z79899 Other long term (current) drug therapy: Secondary | ICD-10-CM | POA: Insufficient documentation

## 2020-07-14 DIAGNOSIS — Y92410 Unspecified street and highway as the place of occurrence of the external cause: Secondary | ICD-10-CM | POA: Diagnosis not present

## 2020-07-14 DIAGNOSIS — R519 Headache, unspecified: Secondary | ICD-10-CM | POA: Insufficient documentation

## 2020-07-14 DIAGNOSIS — R112 Nausea with vomiting, unspecified: Secondary | ICD-10-CM | POA: Diagnosis not present

## 2020-07-14 MED ORDER — IBUPROFEN 600 MG PO TABS: 600.0000 mg | ORAL_TABLET | Freq: Four times a day (QID) | ORAL | 0 refills | Status: DC | PRN

## 2020-07-14 MED ORDER — TIZANIDINE HCL 4 MG PO TABS: 4.0000 mg | ORAL_TABLET | Freq: Four times a day (QID) | ORAL | 0 refills | Status: DC | PRN
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
## 2020-07-14 NOTE — Discharge Instructions (Signed)
If you experience worsening headaches, blurred vision, double vision, persistent nausea and vomiting please go to the emergency room to have further evaluation done. Take medications as prescribed.
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
## 2020-07-15 ENCOUNTER — Other Ambulatory Visit: Payer: Self-pay

## 2020-07-15 ENCOUNTER — Encounter (HOSPITAL_COMMUNITY): Payer: Self-pay | Admitting: Emergency Medicine

## 2020-07-15 ENCOUNTER — Emergency Department (HOSPITAL_COMMUNITY): Payer: Medicaid Other

## 2020-07-15 ENCOUNTER — Emergency Department (HOSPITAL_COMMUNITY)
Admission: EM | Admit: 2020-07-15 | Discharge: 2020-07-15 | Disposition: A | Discharge status: Home / Self Care | Payer: Medicaid Other | Attending: Emergency Medicine | Admitting: Emergency Medicine

## 2020-07-15 DIAGNOSIS — G44319 Acute post-traumatic headache, not intractable: Secondary | ICD-10-CM

## 2020-07-15 DIAGNOSIS — R112 Nausea with vomiting, unspecified: Secondary | ICD-10-CM

## 2020-07-15 MED ORDER — ONDANSETRON HCL 4 MG PO TABS: 4.0000 mg | ORAL_TABLET | Freq: Four times a day (QID) | ORAL | 0 refills | Status: DC | PRN

## 2020-07-15 MED ORDER — ACETAMINOPHEN 325 MG PO TABS
650.0000 mg | ORAL_TABLET | Freq: Once | ORAL | Status: AC
  Administered 2020-07-15: 06:00:00 650 mg via ORAL
  Filled 2020-07-15: qty 2

## 2020-07-15 MED ORDER — ONDANSETRON 8 MG PO TBDP
8.0000 mg | ORAL_TABLET | Freq: Once | ORAL | Status: AC
  Administered 2020-07-15: 06:00:00 8 mg via ORAL
  Filled 2020-07-15: qty 1
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
## 2020-07-15 NOTE — Discharge Instructions (Addendum)
Your scan did not show any sign of any serious head injury.  Please apply ice to all areas that are sore.  Ice to be applied for 30 minutes at a time, 4 times a day.  Take the ibuprofen and tizanidine which were prescribed at your urgent care visit.  You may also add acetaminophen for additional pain relief.

## 2020-12-06 ENCOUNTER — Ambulatory Visit (INDEPENDENT_AMBULATORY_CARE_PROVIDER_SITE_OTHER): Payer: Medicaid Other

## 2020-12-06 ENCOUNTER — Ambulatory Visit
Admission: EM | Admit: 2020-12-06 | Discharge: 2020-12-06 | Disposition: A | Discharge status: Home / Self Care | Payer: Medicaid Other | Attending: Family Medicine | Admitting: Family Medicine

## 2020-12-06 ENCOUNTER — Other Ambulatory Visit: Payer: Self-pay

## 2020-12-06 DIAGNOSIS — M79671 Pain in right foot: Secondary | ICD-10-CM | POA: Diagnosis not present

## 2020-12-06 DIAGNOSIS — S93601A Unspecified sprain of right foot, initial encounter: Secondary | ICD-10-CM

## 2020-12-06 DIAGNOSIS — W19XXXA Unspecified fall, initial encounter: Secondary | ICD-10-CM

## 2020-12-06 NOTE — ED Provider Notes (Signed)
[facility]    CSN: [id number] Arrival date & time: 12/06/20  1849      History   Chief Complaint Chief Complaint  Patient presents with  . Foot Injury    HPI Tracy Berger is a 30 y.o. female.   Reports that she has been having right foot pain for the last 10 days.  States that she was walking in her yard, and that she rolled her right foot laterally and fell.  States that she has been taking ibuprofen only with little relief.  States that she has been icing and elevating the foot as well.  States that there is pain with walking.  Denies previous symptoms.  Denies numbness, tingling, loss of strength, bruising, redness, swelling, heat, other symptoms.  ROS per HPI  The history is provided by the patient.  Foot Injury   Past Medical History:  Diagnosis Date  . Allergy   . Anxiety   . Asthma   . Depression   . Didelphic uterus   . 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 GERD (gastroesophageal reflux disease)   . Gestational diabetes    with 1st pregnancy  . HOH (hard of hearing) 01/05/2014  . Supervision of other high-risk pregnancy(V23.89) 01/05/2014    Patient Active Problem List   Diagnosis Date Noted  . Status post cesarean section 07/21/2014  . Gestational diabetes mellitus in childbirth, diet controlled 07/07/2014  . Previous cesarean delivery, antepartum 01/20/2014  . HOH (hard of hearing) 01/05/2014  . 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 GERD 11/24/2008  . GI BLEEDING 11/24/2008    Past Surgical History:  Procedure Laterality Date  . CESAREAN SECTION    . CESAREAN SECTION N/A 07/20/2014   Procedure: CESAREAN SECTION;  Surgeon: Truett Mainland, DO;  Location: [facility];  Service: Obstetrics;  Laterality: N/A;  . STAPEDECTOMY Left 2018  . WISDOM TOOTH EXTRACTION      OB History    Gravida  2   Para  2   Term  2   Preterm      AB      Living  1     SAB      IAB       Ectopic      Multiple  0   Live Births  1            Home Medications    Prior to Admission medications   Medication Sig Start Date End Date Taking? Authorizing Provider  escitalopram (LEXAPRO) 5 MG tablet Take 30 mg by mouth daily.     [provider]  ibuprofen (ADVIL) 600 MG tablet Take 1 tablet (600 mg total) by mouth every 6 (six) hours as needed. 07/14/20   Chase Picket, MD  ondansetron (ZOFRAN) 4 MG tablet Take 1 tablet (4 mg total) by mouth every 6 (six) hours as needed for nausea or vomiting. 61/60/73   Delora Fuel, MD  tiZANidine (ZANAFLEX) 4 MG tablet Take 1 tablet (4 mg total) by mouth every 6 (six) hours as needed for muscle spasms. 07/14/20   LampteyMyrene Galas, MD    Family History Family History  Problem Relation Age of Onset  . Cancer Mother 68       colon  . Hearing loss Mother   . Colon cancer Mother   . Hypertension Father   . Diabetes Father   . Birth defects Maternal Uncle  hole in his heart at birth  . Hearing loss Maternal Uncle   . Heart disease Maternal Grandmother   . Hearing loss Maternal Grandmother   . Diabetes Maternal Grandfather   . Diabetes Paternal Grandmother   . Diabetes Paternal Aunt   . Diabetes Paternal Uncle   . Esophageal cancer Neg Hx     Social History Social History   Tobacco Use  . Smoking status: Current Some Day Smoker    Types: Cigarettes  . Smokeless tobacco: Never Used  Substance Use Topics  . Alcohol use: Yes    Comment: occasional  . 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   Review of Systems Review of Systems   Physical Exam Triage Vital Signs ED Triage Vitals  Enc Vitals Group     BP 12/06/20 1858 138/87     Pulse Rate 12/06/20 1858 (!) 114     Resp 12/06/20 1858 20     Temp 12/06/20 1858 98.5 F (36.9 C)     Temp src --      SpO2 12/06/20 1858 95 %     Weight --      Height --      Head Circumference  --      Peak Flow --      Pain Score 12/06/20 1856 9     Pain Loc --      Pain Edu? --      Excl. in [place]? --    No data found.  Updated Vital Signs BP 138/87   Pulse (!) 114   Temp 98.5 F (36.9 C)   Resp 20   LMP 12/02/2020   SpO2 95%      Physical Exam Vitals and nursing note reviewed.  Constitutional:      General: She is not in acute distress.    Appearance: Normal appearance. She is well-developed. She is obese.  HENT:     Head: Normocephalic and atraumatic.  Eyes:     Extraocular Movements: Extraocular movements intact.     Conjunctiva/sclera: Conjunctivae normal.     Pupils: Pupils are equal, round, and reactive to light.  Cardiovascular:     Rate and Rhythm: Normal rate and regular rhythm.  Pulmonary:     Effort: Pulmonary effort is normal.  Musculoskeletal:        General: Tenderness present.     Cervical back: Normal range of motion and neck supple.     Comments: Lateral aspect of the dorsal right foot mildly swollen.  Mild tenderness to the area.  No erythema, bruising, heat, deformity noted.  Skin:    General: Skin is warm and dry.     Capillary Refill: Capillary refill takes less than 2 seconds.  Neurological:     General: No focal deficit present.     Mental Status: She is alert and oriented to person, place, and time.  Psychiatric:        Mood and Affect: Mood normal.        Behavior: Behavior normal.        Thought Content: Thought content normal.      UC Treatments / Results  Labs (all labs ordered are listed, but only abnormal results are displayed) Labs Reviewed - No data to display  EKG   Radiology DG Foot Complete Right  Result Date: 12/06/2020 CLINICAL DATA:  Right foot pain after  stepping wrong last night. EXAM: RIGHT FOOT COMPLETE - 3+ VIEW COMPARISON:  None. FINDINGS: There is no evidence of fracture or dislocation. There is no evidence of arthropathy or other focal bone abnormality. Plantar calcaneal spur. Soft tissue edema  overlies the dorsum of the foot. IMPRESSION: Soft tissue edema. No acute fracture or dislocation. Electronically Signed   By: Keith Rake M.D.   On: 12/06/2020 19:16    Procedures Procedures (including critical care time)  Medications Ordered in UC Medications - No data to display  Initial Impression / Assessment and Plan / UC Course  I have reviewed the triage vital signs and the nursing notes.  Pertinent labs & imaging results that were available during my care of the patient were reviewed by me and considered in my medical decision making (see chart for details).    Fall Right foot pain Right foot sprain  X-ray of the right foot today was negative for fracture or misalignment Ace wrap applied to the right foot May use ice, elevation May take up to 800 mg of ibuprofen, 1000 mg of Tylenol together.  Do not exceed 4000 mg Tylenol in 24 hours Follow-up with orthopedics or sports medicine if symptoms are persisting Follow-up in the ER for loss of strength or sensation in the foot, color changes in foot, temperature changes in foot, other concerning symptoms  Final Clinical Impressions(s) / UC Diagnoses   Final diagnoses:  Right foot pain  Fall, initial encounter  Sprain of right foot, initial encounter     Discharge Instructions     X-ray of the right foot today was negative for any fractures or misalignments  You have sprained your right foot  We have given an Ace wrap to keep the area wrapped when active  Keep the area elevated and you may use ice as needed  I would have you take ibuprofen 800 mg with 2000 mg of Tylenol.  Do not exceed 4000 mg Tylenol in 24 hours.  Follow-up with orthopedics or sports medicine if symptoms are persisting  Follow-up with the ER for numbness or tingling in the foot, color change of the foot, temperature change of the foot, other concerning symptoms    ED Prescriptions    None     PDMP not reviewed this encounter.    Faustino Congress, NP 12/06/20 1933

## 2020-12-06 NOTE — ED Triage Notes (Signed)
Pt presents with right foot pain from stepping wrong last night

## 2020-12-06 NOTE — Discharge Instructions (Addendum)
X-ray of the right foot today was negative for any fractures or misalignments  You have sprained your right foot  We have given an Ace wrap to keep the area wrapped when active  Keep the area elevated and you may use ice as needed  I would have you take ibuprofen 800 mg with 2000 mg of Tylenol.  Do not exceed 4000 mg Tylenol in 24 hours.  Follow-up with orthopedics or sports medicine if symptoms are persisting  Follow-up with the ER for numbness or tingling in the foot, color change of the foot, temperature change of the foot, other concerning symptoms

## 2021-03-14 ENCOUNTER — Ambulatory Visit (INDEPENDENT_AMBULATORY_CARE_PROVIDER_SITE_OTHER): Payer: Medicaid Other | Admitting: Adult Health

## 2021-03-14 ENCOUNTER — Encounter: Payer: Self-pay | Admitting: Adult Health

## 2021-03-14 ENCOUNTER — Other Ambulatory Visit (HOSPITAL_COMMUNITY)
Admission: RE | Admit: 2021-03-14 | Discharge: 2021-03-14 | Disposition: A | Discharge status: Home / Self Care | Payer: Medicaid Other | Source: Ambulatory Visit | Attending: Adult Health | Admitting: Adult Health

## 2021-03-14 ENCOUNTER — Other Ambulatory Visit: Payer: Self-pay

## 2021-03-14 VITALS — BP 138/86 | HR 102 | Ht 65.25 in | Wt 282.0 lb

## 2021-03-14 DIAGNOSIS — Q5128 Other doubling of uterus, other specified: Secondary | ICD-10-CM | POA: Insufficient documentation

## 2021-03-14 DIAGNOSIS — Z01419 Encounter for gynecological examination (general) (routine) without abnormal findings: Secondary | ICD-10-CM

## 2021-03-14 DIAGNOSIS — K625 Hemorrhage of anus and rectum: Secondary | ICD-10-CM | POA: Diagnosis not present

## 2021-03-14 DIAGNOSIS — N809 Endometriosis, unspecified: Secondary | ICD-10-CM | POA: Diagnosis not present

## 2021-03-14 LAB — HEMOCCULT GUIAC POC 1CARD (OFFICE): Fecal Occult Blood, POC: NEGATIVE

## 2021-03-14 NOTE — Progress Notes (Addendum)
Patient ID: Tracy Berger, female   DOB: [birth year], 30 y.o.   MRN: [id number] History of Present Illness: Tracy Berger is a 30 year old white female, married, G2P2 in for well woman gyn exam and pap, she has didelphic uterus and 2 cervix. She is complaining of swelling and pain a C section scar when has period and red rectal bleeding when on period. She already gets colonoscopies due to family history. PCP is Eston Esters NP.   Current Medications, Allergies, Past Medical History, Past Surgical History, Family History and Social History were reviewed in [employer] record.     Review of Systems: Patient denies any headaches, hearing loss, fatigue, blurred vision, shortness of breath, chest pain, abdominal pain, problems with bowel movements, urination, or intercourse. No joint pain or mood swings.  See HPI for positives.    Physical Exam:BP 138/86 (BP Location: Left Arm, Patient Position: Sitting, Cuff Size: Large)   Pulse (!) 102   Ht 5' 5.25" (1.657 m)   Wt 282 lb (127.9 kg)   LMP 02/21/2021 (Approximate)   BMI 46.57 kg/m   General:  Well developed, well nourished, no acute distress,she wears a hearing aide Skin:  Warm and dry Neck:  Midline trachea, normal thyroid, good ROM, no lymphadenopathy Lungs; Clear to auscultation bilaterally Breast:  No dominant palpable mass, retraction, or nipple discharge Cardiovascular: Regular rate and rhythm Abdomen:  Soft, non tender, no hepatosplenomegaly,has some thickness like scar tissue at C section scar Pelvic:  External genitalia is normal in appearance, no lesions.  The vagina is normal in appearance,has septum. Urethra has no lesions or masses. The cervix on the left is tiny, pap performed with HR HPV genotyping and on the right tiny and friable with EC brush , pap with HR HPV genotyping performed.Uterus is felt to be normal size, shape, and contour.  No adnexal masses or tenderness noted.Bladder is non tender, no  masses felt. Rectal: Good sphincter tone, no polyps, or hemorrhoids felt.  Hemoccult negative. Extremities/musculoskeletal:  No swelling or varicosities noted, no clubbing or cyanosis Psych:  No mood changes, alert and cooperative,seems happy AA is 6 Fall risk is moderate Depression screen PHQ 2/9 03/14/2021  Decreased Interest 1  Down, Depressed, Hopeless 0  PHQ - 2 Score 1  Altered sleeping 0  Tired, decreased energy 1  Change in appetite 1  Feeling bad or failure about yourself  1  Trouble concentrating 1  Moving slowly or fidgety/restless 0  Suicidal thoughts 0  PHQ-9 Score 5    GAD 7 : Generalized Anxiety Score 03/14/2021  Nervous, Anxious, on Edge 1  Control/stop worrying 1  Worry too much - different things 1  Trouble relaxing 2  Restless 1  Easily annoyed or irritable 1  Afraid - awful might happen 0  Total GAD 7 Score 7    Upstream - 03/14/21 1546       Pregnancy Intention Screening   Does the patient want to become pregnant in the next year? No    Does the patient's partner want to become pregnant in the next year? No    Would the patient like to discuss contraceptive options today? No      Contraception Wrap Up   Current Method Vasectomy    End Method Vasectomy    Contraception Counseling Provided No            Examination chaperoned by Levy Pupa LPN     Impression and Plan: 1. Encounter for gynecological examination with  Papanicolaou smear of cervix Pap sent x 2 Physical in 1 year Pap in 3 years if normal  2. Didelphic uterus,has 2 cervix too She says her mom and grandma have this also  3. Rectal bleeding See GI as scheduled   4. Endometriosis, by symptoms Discussed could try orlissa,or see Dr Elonda Husky  or just watch and she wants to watch

## 2021-03-16 LAB — CYTOLOGY - PAP
Chlamydia: NEGATIVE
Comment: NEGATIVE
Comment: NEGATIVE
Comment: NORMAL
Diagnosis: NEGATIVE
High risk HPV: NEGATIVE
Neisseria Gonorrhea: NEGATIVE

## 2021-03-17 LAB — CYTOLOGY - PAP
Chlamydia: NEGATIVE
Comment: NEGATIVE
Comment: NEGATIVE
Comment: NORMAL
Diagnosis: UNDETERMINED — AB
High risk HPV: NEGATIVE
Neisseria Gonorrhea: NEGATIVE

## 2021-03-20 ENCOUNTER — Encounter: Payer: Self-pay | Admitting: Adult Health

## 2021-03-20 DIAGNOSIS — R87610 Atypical squamous cells of undetermined significance on cytologic smear of cervix (ASC-US): Secondary | ICD-10-CM

## 2021-03-20 HISTORY — DX: Atypical squamous cells of undetermined significance on cytologic smear of cervix (ASC-US): R87.610

## 2021-08-09 ENCOUNTER — Emergency Department (HOSPITAL_COMMUNITY)
Admission: EM | Admit: 2021-08-09 | Discharge: 2021-08-10 | Disposition: A | Discharge status: Home / Self Care | Payer: Medicaid Other | Attending: Emergency Medicine | Admitting: Emergency Medicine

## 2021-08-09 ENCOUNTER — Other Ambulatory Visit: Payer: Self-pay

## 2021-08-09 ENCOUNTER — Encounter (HOSPITAL_COMMUNITY): Payer: Self-pay | Admitting: Emergency Medicine

## 2021-08-09 DIAGNOSIS — Z20822 Contact with and (suspected) exposure to covid-19: Secondary | ICD-10-CM | POA: Insufficient documentation

## 2021-08-09 DIAGNOSIS — J45909 Unspecified asthma, uncomplicated: Secondary | ICD-10-CM | POA: Diagnosis not present

## 2021-08-09 DIAGNOSIS — N9489 Other specified conditions associated with female genital organs and menstrual cycle: Secondary | ICD-10-CM | POA: Diagnosis not present

## 2021-08-09 DIAGNOSIS — F17210 Nicotine dependence, cigarettes, uncomplicated: Secondary | ICD-10-CM | POA: Insufficient documentation

## 2021-08-09 DIAGNOSIS — R0602 Shortness of breath: Secondary | ICD-10-CM

## 2021-08-09 DIAGNOSIS — R053 Chronic cough: Secondary | ICD-10-CM | POA: Insufficient documentation

## 2021-08-09 NOTE — ED Triage Notes (Signed)
Pt c/o cough and sob ongoing for months.

## 2021-08-10 ENCOUNTER — Emergency Department (HOSPITAL_COMMUNITY): Payer: Medicaid Other

## 2021-08-10 LAB — CBC
HCT: 35.4 % — ABNORMAL LOW (ref 36.0–46.0)
Hemoglobin: 11.3 g/dL — ABNORMAL LOW (ref 12.0–15.0)
MCH: 27.4 pg (ref 26.0–34.0)
MCHC: 31.9 g/dL (ref 30.0–36.0)
MCV: 85.9 fL (ref 80.0–100.0)
Platelets: 316 10*3/uL (ref 150–400)
RBC: 4.12 MIL/uL (ref 3.87–5.11)
RDW: 13.9 % (ref 11.5–15.5)
WBC: 9.2 10*3/uL (ref 4.0–10.5)
nRBC: 0 % (ref 0.0–0.2)

## 2021-08-10 LAB — RESP PANEL BY RT-PCR (FLU A&B, COVID) ARPGX2
Influenza A by PCR: NEGATIVE
Influenza B by PCR: NEGATIVE
SARS Coronavirus 2 by RT PCR: NEGATIVE

## 2021-08-10 LAB — BASIC METABOLIC PANEL
Anion gap: 9 (ref 5–15)
BUN: 13 mg/dL (ref 6–20)
CO2: 23 mmol/L (ref 22–32)
Calcium: 8.8 mg/dL — ABNORMAL LOW (ref 8.9–10.3)
Chloride: 101 mmol/L (ref 98–111)
Creatinine, Ser: 0.62 mg/dL (ref 0.44–1.00)
GFR, Estimated: >60 mL/min (ref >60)
Glucose, Bld: 185 mg/dL — ABNORMAL HIGH (ref 70–99)
Potassium: 3.7 mmol/L (ref 3.5–5.1)
Sodium: 133 mmol/L — ABNORMAL LOW (ref 135–145)

## 2021-08-10 LAB — BRAIN NATRIURETIC PEPTIDE: B Natriuretic Peptide: 19 pg/mL (ref 0.0–100.0)

## 2021-08-10 LAB — HCG, SERUM, QUALITATIVE: Preg, Serum: NEGATIVE

## 2021-08-10 LAB — TROPONIN I (HIGH SENSITIVITY): Troponin I (High Sensitivity): 2 ng/L (ref <18)

## 2021-08-10 MED ORDER — BENZONATATE 100 MG PO CAPS: 100.0000 mg | ORAL_CAPSULE | Freq: Three times a day (TID) | ORAL | 0 refills | Status: DC

## 2021-08-10 MED ORDER — IOHEXOL 350 MG/ML SOLN
100.0000 mL | Freq: Once | INTRAVENOUS | Status: AC | PRN
  Administered 2021-08-10: 100 mL via INTRAVENOUS

## 2021-08-10 MED ORDER — IPRATROPIUM-ALBUTEROL 0.5-2.5 (3) MG/3ML IN SOLN
3.0000 mL | Freq: Once | RESPIRATORY_TRACT | Status: AC
  Administered 2021-08-10: 3 mL via RESPIRATORY_TRACT
  Filled 2021-08-10: qty 3

## 2021-08-10 NOTE — ED Provider Notes (Signed)
[facility] Emergency Department Provider Note MRN:  [id number]  Arrival date & time: 08/10/21     Chief Complaint   Shortness of Breath   History of Present Illness   Tracy Berger is a 31 y.o. year-old female with no pertinent past medical history presenting to the ED with chief complaint of shortness of breath.  Chronic cough for the past 2 years, worsening and associated with frequent upper respiratory infections.  Experiencing worsening shortness of breath over the past few days, explains that she cannot take a deep breath because it triggers coughing fits.  Denies any significant chest pain, no fever, no abdominal pain, no leg pain or swelling.  No recent travel, no birth control pills.  Review of Systems  A thorough review of systems was obtained and all systems are negative except as noted in the HPI and PMH.   Patient's Health History    Past Medical History:  Diagnosis Date   Allergy    Anxiety    ASCUS of cervix with negative high risk HPV 03/20/2021   Pap showed ASCUS, negative HPV, other pap negative. 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/2014   Has 2 uterus and 2 cervix and septum in vagina   GERD (gastroesophageal reflux disease)    Gestational diabetes    with 1st pregnancy   HOH (hard of hearing) 01/05/2014   Supervision of other high-risk pregnancy(V23.89) 01/05/2014    Past Surgical History:  Procedure Laterality Date   CESAREAN SECTION     CESAREAN SECTION N/A 07/20/2014   Procedure: CESAREAN SECTION;  Surgeon: Truett Mainland, DO;  Location: [facility];  Service: Obstetrics;  Laterality: N/A;   STAPEDECTOMY Left 2018   WISDOM TOOTH EXTRACTION      Family History  Problem Relation Age of Onset   Diabetes Paternal Grandmother    Heart disease Maternal Grandmother    Hearing loss Maternal Grandmother    Diabetes Maternal Grandfather     Hypertension Father    Diabetes Father    Cancer Mother 49       colon   Hearing loss Mother    Colon cancer Mother    Birth defects Maternal Uncle        hole in his heart at birth   Hearing loss Maternal Uncle    Diabetes Paternal Aunt    Diabetes Paternal Uncle    Esophageal cancer Neg Hx     Social History   Socioeconomic History   Marital status: Married    Spouse name: Not on file   Number of children: 2   Years of education: Not on file   Highest education level: Not on file  Occupational History   Occupation: Homemaker  Tobacco Use   Smoking status: Every Day    Packs/day: 0.50    Years: 8.00    Pack years: 4.00    Types: Cigarettes   Smokeless tobacco: Never  Vaping Use   Vaping Use: Never used  Substance and Sexual Activity   Alcohol use: Yes    Comment: occasional   Drug use: Not Currently    Types: IV    Comment: quit 7 years ago   Sexual activity: Yes    Birth control/protection: Surgical    Comment: husband had vasectomy  Other Topics Concern   Not on file  Social History  Narrative   Not on file   Social Determinants of Health   Financial Resource Strain: Medium Risk   Difficulty of Paying Living Expenses: Somewhat hard  Food Insecurity: No Food Insecurity   Worried About Running Out of Food in the Last Year: Never true   Ran Out of Food in the Last Year: Never true  Transportation Needs: No Transportation Needs   Lack of Transportation (Medical): No   Lack of Transportation (Non-Medical): No  Physical Activity: Sufficiently Active   Days of Exercise per Week: 4 days   Minutes of Exercise per Session: 60 min  Stress: Stress Concern Present   Feeling of Stress : To some extent  Social Connections: Socially Isolated   Frequency of Communication with Friends and Family: Once a week   Frequency of Social Gatherings with Friends and Family: Once a week   Attends Religious Services: Never   Marine scientist or Organizations: No    Attends Music therapist: Never   Marital Status: Living with partner  Intimate Partner Violence: Not At Risk   Fear of Current or Ex-Partner: No   Emotionally Abused: No   Physically Abused: No   Sexually Abused: No     Physical Exam   Vitals:   08/10/21 0030 08/10/21 0047  BP: 127/76   Pulse: (!) 106   Resp: (!) 22   Temp:    SpO2: 98% 99%    CONSTITUTIONAL: Well-appearing, NAD NEURO/PSYCH:  Alert and oriented x 3, no focal deficits EYES:  eyes equal and reactive ENT/NECK:  no LAD, no JVD CARDIO: Tachycardic rate, well-perfused, normal S1 and S2 PULM:  CTAB no wheezing or rhonchi GI/GU:  non-distended, non-tender MSK/SPINE:  No gross deformities, no edema SKIN:  no rash, atraumatic   *Additional and/or pertinent findings included in MDM below  Diagnostic and Interventional Summary    EKG Interpretation  Date/Time:  Wednesday August 09 2021 23:48:40 EST Ventricular Rate:  113 PR Interval:  106 QRS Duration: 75 QT Interval:  408 QTC Calculation: 560 R Axis:   61 Text Interpretation: Sinus tachycardia Borderline T abnormalities, anterior leads Prolonged QT interval Baseline wander Confirmed by Gerlene Fee [phone]) on 08/10/2021 12:57:26 AM       Labs Reviewed  CBC - Abnormal; Notable for the following components:      Result Value   Hemoglobin 11.3 (*)    HCT 35.4 (*)    All other components within normal limits  BASIC METABOLIC PANEL - Abnormal; Notable for the following components:   Sodium 133 (*)    Glucose, Bld 185 (*)    Calcium 8.8 (*)    All other components within normal limits  RESP PANEL BY RT-PCR (FLU A&B, COVID) ARPGX2  BRAIN NATRIURETIC PEPTIDE  HCG, SERUM, QUALITATIVE  TROPONIN I (HIGH SENSITIVITY)    CT Angio Chest Pulmonary Embolism (PE) W or WO Contrast  Final Result      Medications  ipratropium-albuterol (DUONEB) 0.5-2.5 (3) MG/3ML nebulizer solution 3 mL (3 mLs Nebulization Given 08/10/21 0047)  iohexol (OMNIPAQUE)  350 MG/ML injection 100 mL (100 mLs Intravenous Contrast Given 08/10/21 0116)     Procedures  /  Critical Care Procedures  ED Course and Medical Decision Making  Initial Impression and Ddx Differential diagnosis includes bronchitis, pleurisy, pneumonia, underlying malignancy, PE.  Obtaining labs, CTA chest.  Past medical/surgical history that increases complexity of ED encounter: Asthma  Interpretation of Diagnostics I personally reviewed the EKG and my interpretation is as follows:  Sinus tachycardia with a prolonged QT interval.    CT scan is without obvious PE or masses or opacities, labs are reassuring with negative troponin, BNP.  Patient Reassessment and Ultimate Disposition/Management Patient is feeling much better after breathing treatment, suspect she has a sensitive cough reflex in the setting of her known asthma, she is appropriate for follow-up with pulmonology as an outpatient.  Patient management required discussion with the following services or consulting groups:  None  Complexity of Problems Addressed Chronic illness with exacerbation  Additional Data Reviewed and Analyzed Further history obtained from: Past medical history and medications listed in the EMR  Patient Encounter Risk Assessment Moderate:  Prescriptions  Barth Kirks. Sedonia Small, Ann[place] [email or web address]  Final Clinical Impressions(s) / ED Diagnoses     ICD-10-CM   1. Chronic cough  R05.3     2. SOB (shortness of breath)  R06.02       ED Discharge Orders          Ordered    benzonatate (TESSALON) 100 MG capsule  Every 8 hours        08/10/21 0204             Discharge Instructions Discussed with and Provided to Patient:     Discharge Instructions      You were evaluated in the Emergency Department and after careful evaluation, we did not find any emergent condition requiring admission or further testing in the hospital.  Your  exam/testing today was overall reassuring.  Recommend continuing your efforts with pulmonology follow-up.  Can try the Tessalon medication for cough.  Please return to the Emergency Department if you experience any worsening of your condition.  Thank you for allowing [place] to be a part of your care.        Maudie Flakes, MD 08/10/21 [phone]

## 2021-08-10 NOTE — Discharge Instructions (Signed)
You were evaluated in the Emergency Department and after careful evaluation, we did not find any emergent condition requiring admission or further testing in the hospital.  Your exam/testing today was overall reassuring.  Recommend continuing your efforts with pulmonology follow-up.  Can try the Tessalon medication for cough.  Please return to the Emergency Department if you experience any worsening of your condition.  Thank you for allowing [place] to be a part of your care.

## 2021-09-27 ENCOUNTER — Ambulatory Visit: Payer: Medicaid Other | Admitting: Pulmonary Disease

## 2021-09-27 ENCOUNTER — Encounter: Payer: Self-pay | Admitting: Pulmonary Disease

## 2021-09-27 ENCOUNTER — Other Ambulatory Visit: Payer: Self-pay

## 2021-09-27 VITALS — BP 124/86 | HR 88 | Ht 65.0 in | Wt 262.4 lb

## 2021-09-27 DIAGNOSIS — J4540 Moderate persistent asthma, uncomplicated: Secondary | ICD-10-CM | POA: Diagnosis not present

## 2021-09-27 MED ORDER — FLUTICASONE-SALMETEROL 115-21 MCG/ACT IN AERO: 2.0000 | INHALATION_SPRAY | Freq: Two times a day (BID) | RESPIRATORY_TRACT | 12 refills | Status: DC

## 2021-09-27 MED ORDER — ALBUTEROL SULFATE (2.5 MG/3ML) 0.083% IN NEBU: 2.5000 mg | INHALATION_SOLUTION | Freq: Four times a day (QID) | RESPIRATORY_TRACT | 12 refills | Status: DC | PRN

## 2021-09-27 MED ORDER — PROAIR HFA 108 (90 BASE) MCG/ACT IN AERS: INHALATION_SPRAY | RESPIRATORY_TRACT | 11 refills | Status: DC

## 2021-09-27 NOTE — Progress Notes (Signed)
Synopsis: Referred in March 2023 for asthma by Vassie Moment, MD  Subjective:   PATIENT ID: Tracy Berger GENDER: female DOB: 04-Aug-1990, MRN: [id number]  HPI  Chief Complaint  Patient presents with   Consult    Referred by PCP for asthma. States she was diagnosed as a child and grew out if it but it came back in her early 73s.    Tracy Berger is a 31 year old woman, daily smoker who is referred to pulmonary clinic for asthma.   She reports history of asthma in childhood where she required advair and then her asthma severeity declined in her late teens and early 25s. Over recent years she has noticed increasing wheezing, chest tightness, and prolonged recoveries from upper respiratory infections. Cold air, laughing and strong perfumes/colognes bother her breathing. She denies issues with seasonal allergies. She complains of chronic rhinitis. She has history of GERD that is controlled on PPI therapy, but her GERD has triggered her respiratory symptoms in the past.  She recently quit smoking as she is planning to have bariatric surgery. She was smoking half a pack per day since 31 years old. She has quit multiple times in the past. No vaping or other drug use. She works as a Education officer, community.   No family history of asthma.  Past Medical History:  Diagnosis Date   Allergy    Anxiety    ASCUS of cervix with negative high risk HPV 03/20/2021   Pap showed ASCUS, negative HPV, other pap negative. 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/2014   Has 2 uterus and 2 cervix and septum in vagina   GERD (gastroesophageal reflux disease)    Gestational diabetes    with 1st pregnancy   HOH (hard of hearing) 01/05/2014   Supervision of other high-risk pregnancy(V23.89) 01/05/2014     Family History  Problem Relation Age of Onset   Diabetes Paternal Grandmother    Heart disease Maternal Grandmother     Hearing loss Maternal Grandmother    Diabetes Maternal Grandfather    Hypertension Father    Diabetes Father    Cancer Mother 20       colon   Hearing loss Mother    Colon cancer Mother    Birth defects Maternal Uncle        hole in his heart at birth   Hearing loss Maternal Uncle    Diabetes Paternal Aunt    Diabetes Paternal Uncle    Esophageal cancer Neg Hx      Social History   Socioeconomic History   Marital status: Married    Spouse name: Not on file   Number of children: 2   Years of education: Not on file   Highest education level: Not on file  Occupational History   Occupation: Homemaker  Tobacco Use   Smoking status: Some Days    Packs/day: 0.50    Years: 8.00    Pack years: 4.00    Types: Cigarettes   Smokeless tobacco: Never  Vaping Use   Vaping Use: Never used  Substance and Sexual Activity   Alcohol use: Yes    Comment: occasional   Drug use: Not Currently    Types: IV    Comment: quit 7 years ago   Sexual activity: Yes    Birth control/protection: Surgical    Comment: husband  had vasectomy  Other Topics Concern   Not on file  Social History Narrative   Not on file   Social Determinants of Health   Financial Resource Strain: Medium Risk   Difficulty of Paying Living Expenses: Somewhat hard  Food Insecurity: No Food Insecurity   Worried About Running Out of Food in the Last Year: Never true   Ran Out of Food in the Last Year: Never true  Transportation Needs: No Transportation Needs   Lack of Transportation (Medical): No   Lack of Transportation (Non-Medical): No  Physical Activity: Sufficiently Active   Days of Exercise per Week: 4 days   Minutes of Exercise per Session: 60 min  Stress: Stress Concern Present   Feeling of Stress : To some extent  Social Connections: Socially Isolated   Frequency of Communication with Friends and Family: Once a week   Frequency of Social Gatherings with Friends and Family: Once a week   Attends  Religious Services: Never   Marine scientist or Organizations: No   Attends Music therapist: Never   Marital Status: Living with partner  Intimate Partner Violence: Not At Risk   Fear of Current or Ex-Partner: No   Emotionally Abused: No   Physically Abused: No   Sexually Abused: No     No Known Allergies   Outpatient Medications Prior to Visit  Medication Sig Dispense Refill   ACCU-CHEK GUIDE test strip USE AS DIRECTED TO CHECK FASTING GLUCOSE AND 2 HOURS AFTER EATING     Accu-Chek Softclix Lancets lancets SMARTSIG:Topical     Armodafinil 150 MG tablet Take 150 mg by mouth every morning.     atorvastatin (LIPITOR) 10 MG tablet Take 10 mg by mouth at bedtime.     Blood Glucose Monitoring Suppl (ACCU-CHEK GUIDE) w/Device KIT USE AS DIRECTED TO CHECK FASTING GLUCOSE DAILY AND 2 HOURS POSTPRANDIAL (AFTER A MEAL)     dexmethylphenidate (FOCALIN XR) 15 MG 24 hr capsule Take 15 mg by mouth daily.     DULoxetine (CYMBALTA) 60 MG capsule      gabapentin (NEURONTIN) 300 MG capsule Take 300 mg by mouth 3 (three) times daily as needed.     hydrOXYzine (ATARAX/VISTARIL) 50 MG tablet Take 50 mg by mouth 4 (four) times daily.     metFORMIN (GLUCOPHAGE) 500 MG tablet SMARTSIG:1 Tablet(s) By Mouth Morning-Evening     naproxen (NAPROSYN) 500 MG tablet Take 500 mg by mouth as needed.     omeprazole (PRILOSEC) 20 MG capsule Take 20 mg by mouth daily.     PROAIR HFA 108 (90 Base) MCG/ACT inhaler SMARTSIG:1 Puff(s) By Mouth Every 6 Hours PRN     benzonatate (TESSALON) 100 MG capsule Take 1 capsule (100 mg total) by mouth every 8 (eight) hours. 21 capsule 0   dexmethylphenidate (FOCALIN) 5 MG tablet Take 5 mg by mouth 2 (two) times daily.     No facility-administered medications prior to visit.   Review of Systems  Constitutional:  Negative for chills, fever, malaise/fatigue and weight loss.  HENT:  Positive for congestion. Negative for sinus pain and sore throat.   Eyes: Negative.    Respiratory:  Positive for cough, shortness of breath and wheezing. Negative for hemoptysis and sputum production.   Cardiovascular:  Negative for chest pain, palpitations, orthopnea, claudication and leg swelling.  Gastrointestinal:  Negative for abdominal pain, heartburn, nausea and vomiting.  Genitourinary: Negative.   Musculoskeletal:  Negative for joint pain and myalgias.  Skin:  Negative  for rash.  Neurological:  Negative for weakness.  Endo/Heme/Allergies: Negative.   Psychiatric/Behavioral:  Positive for depression. The patient is nervous/anxious.    Objective:   Vitals:   09/27/21 1134  BP: 124/86  Pulse: 88  SpO2: 99%  Weight: 262 lb 6.4 oz (119 kg)  Height: '5\' 5"'  (1.651 m)   Physical Exam Constitutional:      General: She is not in acute distress.    Appearance: She is obese. She is not ill-appearing.  HENT:     Head: Normocephalic and atraumatic.  Eyes:     General: No scleral icterus.    Conjunctiva/sclera: Conjunctivae normal.     Pupils: Pupils are equal, round, and reactive to light.  Cardiovascular:     Rate and Rhythm: Normal rate and regular rhythm.     Pulses: Normal pulses.     Heart sounds: Normal heart sounds. No murmur heard. Pulmonary:     Effort: Pulmonary effort is normal.     Breath sounds: Normal breath sounds. No wheezing, rhonchi or rales.  Abdominal:     General: Bowel sounds are normal.     Palpations: Abdomen is soft.  Musculoskeletal:     Right lower leg: No edema.     Left lower leg: No edema.  Lymphadenopathy:     Cervical: No cervical adenopathy.  Skin:    General: Skin is warm and dry.  Neurological:     General: No focal deficit present.     Mental Status: She is alert.  Psychiatric:        Mood and Affect: Mood normal.        Behavior: Behavior normal.        Thought Content: Thought content normal.        Judgment: Judgment normal.   CBC    Component Value Date/Time   WBC 9.2 08/10/2021 0015   RBC 4.12 08/10/2021  0015   HGB 11.3 (L) 08/10/2021 0015   HCT 35.4 (L) 08/10/2021 0015   PLT 316 08/10/2021 0015   MCV 85.9 08/10/2021 0015   MCH 27.4 08/10/2021 0015   MCHC 31.9 08/10/2021 0015   RDW 13.9 08/10/2021 0015   LYMPHSABS 1.3 04/10/2011 1813   MONOABS 0.5 04/10/2011 1813   EOSABS 0.3 04/10/2011 1813   BASOSABS 0.0 04/10/2011 1813   BMP Latest Ref Rng & Units 08/10/2021 04/10/2011 10/23/2008  Glucose 70 - 99 mg/dL 185(H) 141(H) 108(H)  BUN 6 - 20 mg/dL '13 14 11  ' Creatinine 0.44 - 1.00 mg/dL 0.62 0.80 0.7  Sodium 135 - 145 mmol/L 133(L) 140 140  Potassium 3.5 - 5.1 mmol/L 3.7 3.4(L) 3.8  Chloride 98 - 111 mmol/L 101 104 108  CO2 22 - 32 mmol/L 23 - -  Calcium 8.9 - 10.3 mg/dL 8.8(L) - -   Chest imaging: CTA Chest 08/10/21 1. Technically limited secondary to timing of the contrast bolus. No definite pulmonary embolism the segmental level. 2. Borderline cardiomegaly. Enlarged main pulmonary artery can be seen with pulmonary artery hypertension. 3. Mild splenomegaly.  PFT: No flowsheet data found.  Labs:  Path:  Echo:  Heart Catheterization:  Assessment & Plan:   Moderate persistent asthma without complication - Plan: fluticasone-salmeterol (ADVAIR HFA) 115-21 MCG/ACT inhaler, Pulmonary Function Test, PROAIR HFA 108 (90 Base) MCG/ACT inhaler  Discussion: Tracy Berger is a 31 year old woman, daily smoker who is referred to pulmonary clinic for asthma.   She appears to have moderate persistent asthma. We will start her on advair 115-97mg  2 puffs twice daily. She can continue albuterol as needed.   We will check IgE and CBC with diff in the future if her symptoms are not controlled with above therapy. We will consider adding montelukast in the future.   She is low risk based on ARISCAT post-operative respiratory complication risk index in regards to her upcoming bariatric surgery.   Follow up in 3 months with pulmonary function tests.   Freda Jackson, MD [facility] Office: [phone]    Current Outpatient Medications:    ACCU-CHEK GUIDE test strip, USE AS DIRECTED TO CHECK FASTING GLUCOSE AND 2 HOURS AFTER EATING, Disp: , Rfl:    Accu-Chek Softclix Lancets lancets, SMARTSIG:Topical, Disp: , Rfl:    Armodafinil 150 MG tablet, Take 150 mg by mouth every morning., Disp: , Rfl:    atorvastatin (LIPITOR) 10 MG tablet, Take 10 mg by mouth at bedtime., Disp: , Rfl:    Blood Glucose Monitoring Suppl (ACCU-CHEK GUIDE) w/Device KIT, USE AS DIRECTED TO CHECK FASTING GLUCOSE DAILY AND 2 HOURS POSTPRANDIAL (AFTER A MEAL), Disp: , Rfl:    dexmethylphenidate (FOCALIN XR) 15 MG 24 hr capsule, Take 15 mg by mouth daily., Disp: , Rfl:    DULoxetine (CYMBALTA) 60 MG capsule, , Disp: , Rfl:    fluticasone-salmeterol (ADVAIR HFA) 115-21 MCG/ACT inhaler, Inhale 2 puffs into the lungs 2 (two) times daily., Disp: 1 each, Rfl: 12   gabapentin (NEURONTIN) 300 MG capsule, Take 300 mg by mouth 3 (three) times daily as needed., Disp: , Rfl:    hydrOXYzine (ATARAX/VISTARIL) 50 MG tablet, Take 50 mg by mouth 4 (four) times daily., Disp: , Rfl:    metFORMIN (GLUCOPHAGE) 500 MG tablet, SMARTSIG:1 Tablet(s) By Mouth Morning-Evening, Disp: , Rfl:    naproxen (NAPROSYN) 500 MG tablet, Take 500 mg by mouth as needed., Disp: , Rfl:    omeprazole (PRILOSEC) 20 MG capsule, Take 20 mg by mouth daily., Disp: , Rfl:    PROAIR HFA 108 (90 Base) MCG/ACT inhaler, SMARTSIG:1 Puff(s) By Mouth Every 6 Hours PRN, Disp: 8 g, Rfl: 11

## 2021-09-27 NOTE — Patient Instructions (Addendum)
Use advair inhaler 2 puffs twice daily ?- rinse mouth out after each use ? ?Use spacer if needed with the advair inhaler ? ?Continue albuterol as needed 1-2 puffs every 4-6 hours as needed ? ?Follow up in 3 months with pulmonary function tests ?

## 2021-09-27 NOTE — Addendum Note (Signed)
Addended by: Valerie Salts on: 09/27/2021 12:18 PM ? ? Modules accepted: Orders ? ?

## 2021-09-29 ENCOUNTER — Telehealth: Payer: Self-pay | Admitting: Pulmonary Disease

## 2021-09-29 NOTE — Telephone Encounter (Signed)
Called patient and she states that since ProAir has been discontinued if she can get something else in replace of it. ? ?I told her I would message Dr Erin Fulling and get information from him. And will call her back with update. ? ?Dr Erin Fulling please advise  ?

## 2021-10-02 MED ORDER — ALBUTEROL SULFATE HFA 108 (90 BASE) MCG/ACT IN AERS: 2.0000 | INHALATION_SPRAY | Freq: Four times a day (QID) | RESPIRATORY_TRACT | 6 refills | Status: DC | PRN

## 2021-10-04 ENCOUNTER — Other Ambulatory Visit (HOSPITAL_COMMUNITY): Payer: Self-pay

## 2021-10-04 NOTE — Telephone Encounter (Signed)
Brand name Ventolin HFA is covered.  No PA needed. ?

## 2021-12-28 ENCOUNTER — Ambulatory Visit: Payer: Medicaid Other | Admitting: Pulmonary Disease

## 2021-12-29 ENCOUNTER — Ambulatory Visit: Payer: Medicaid Other | Admitting: Pulmonary Disease

## 2022-01-01 ENCOUNTER — Other Ambulatory Visit: Payer: Self-pay | Admitting: Pulmonary Disease

## 2022-01-01 DIAGNOSIS — J4540 Moderate persistent asthma, uncomplicated: Secondary | ICD-10-CM

## 2022-01-03 ENCOUNTER — Ambulatory Visit: Payer: Medicaid Other | Admitting: Pulmonary Disease

## 2022-01-05 ENCOUNTER — Encounter: Payer: Self-pay | Admitting: Gastroenterology

## 2022-03-30 ENCOUNTER — Ambulatory Visit
Admission: EM | Admit: 2022-03-30 | Discharge: 2022-03-30 | Disposition: A | Discharge status: Home / Self Care | Payer: Medicaid Other | Attending: Nurse Practitioner | Admitting: Nurse Practitioner

## 2022-03-30 DIAGNOSIS — R112 Nausea with vomiting, unspecified: Secondary | ICD-10-CM | POA: Diagnosis not present

## 2022-03-30 DIAGNOSIS — R1013 Epigastric pain: Secondary | ICD-10-CM | POA: Diagnosis not present

## 2022-03-30 LAB — POCT URINALYSIS DIP (MANUAL ENTRY)
Blood, UA: NEGATIVE
Glucose, UA: NEGATIVE mg/dL
Ketones, POC UA: NEGATIVE mg/dL
Leukocytes, UA: NEGATIVE
Nitrite, UA: NEGATIVE
Protein Ur, POC: 30 mg/dL — AB
Spec Grav, UA: >=1.03 — AB (ref 1.010–1.025)
Urobilinogen, UA: 1 E.U./dL
pH, UA: 5.5 (ref 5.0–8.0)

## 2022-03-30 MED ORDER — ONDANSETRON 4 MG PO TBDP: 4.0000 mg | ORAL_TABLET | Freq: Three times a day (TID) | ORAL | 0 refills | Status: DC | PRN

## 2022-03-30 NOTE — ED Triage Notes (Signed)
Pt reports abdominal pain x 2 days, state sis related to an umbilical hernia. Pt think she needs surgery as she feels the hernia "opened up". States she feels the hernia is pushing her bladder, stomach feels closed, vomiting after eating.

## 2022-03-30 NOTE — Discharge Instructions (Addendum)
-   You can use Zofran under your tongue every 8 hours as needed for nausea/vomiting - Call General Surgeon's office to schedule an appointment (contact information is provided) - Go to ER if symptoms worsen

## 2022-03-30 NOTE — ED Provider Notes (Signed)
[facility]    CSN: [id number] Arrival date & time: 03/30/22  1316      History   Chief Complaint Chief Complaint  Patient presents with   Hernia    HPI Tracy Berger is a 31 y.o. female.   Patient presents with a couple of days of abdominal pain.  Reports a history of an umbilical hernia that she felt "pop out," a couple of days ago.  It sounds like she was able to reduce it at home.  However, since then, she has been concerned that the hernia keeps coming and going and is getting worse.  She states that she feels like her stomach is "open."  She has also been struggling eating and keeping food down, reports she vomits solid food up.  Denies any fever, dysphagia, vomiting blood, blood in her stool, unexplained weight loss, diarrhea, blood in her stool, rash, dysuria/urinary frequency, hematuria, frequent NSAID use.  She is having some nausea/vomiting that is worse after eating, decreased appetite because she vomits after eating, and some constipation.  Reports she took herself off of omeprazole a couple of months ago and reports that she does not want to restart it because "it was terrible to come off of it."  Has taken Tums for her symptoms with minimal relief.  Heavy lifting, coughing, sneezing makes the pain and "bulging" worse.  She has been drinking protein shakes and keeping them down well.    Past Medical History:  Diagnosis Date   Allergy    Anxiety    ASCUS of cervix with negative high risk HPV 03/20/2021   Pap showed ASCUS, negative HPV, other pap negative. 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/2014   Has 2 uterus and 2 cervix and septum in vagina   GERD (gastroesophageal reflux disease)    Gestational diabetes    with 1st pregnancy   HOH (hard of hearing) 01/05/2014   Supervision of other high-risk pregnancy(V23.89) 01/05/2014    Patient Active Problem List    Diagnosis Date Noted   ASCUS of cervix with negative high risk HPV 03/20/2021   Encounter for gynecological examination with Papanicolaou smear of cervix 03/14/2021   Rectal bleeding 03/14/2021   Didelphic uterus 03/14/2021   Endometriosis 03/14/2021   Status post cesarean section 07/21/2014   Gestational diabetes mellitus in childbirth, diet controlled 07/07/2014   Previous cesarean delivery, antepartum 01/20/2014   HOH (hard of hearing) 01/05/2014   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 Authorizing Provider  ondansetron (ZOFRAN-ODT) 4 MG disintegrating tablet Take 1 tablet (4 mg total) by mouth every 8 (eight) hours as needed for nausea or vomiting. 03/30/22  Yes Eulogio Bear, NP  ACCU-CHEK GUIDE test strip USE AS DIRECTED TO CHECK FASTING GLUCOSE AND 2 HOURS AFTER EATING 02/23/21   [provider]  Accu-Chek Softclix Lancets lancets SMARTSIG:Topical 01/25/21   [provider]  albuterol (PROVENTIL) (2.5 MG/3ML) 0.083% nebulizer solution Take 3 mLs (2.5 mg total) by nebulization every 6 (six) hours as needed for wheezing or shortness of breath. 09/27/21   Freddi Starr, MD  albuterol (VENTOLIN HFA) 108 (90 Base) MCG/ACT inhaler Inhale 2 puffs into the lungs every 6 (six) hours as needed for wheezing or shortness of breath. 10/02/21   Freddi Starr, MD   albuterol (VENTOLIN HFA) 108 (90 Base) MCG/ACT inhaler Inhale 1-2 puffs into the lungs every 6 (six) hours as needed for wheezing or shortness of breath. INHALE 1 PUFF BY MOUTH EVERY 6 HOURS AS NEEDED 01/02/22   Freddi Starr, MD  Armodafinil 150 MG tablet Take 150 mg by mouth every morning. 03/02/21   [provider]  atorvastatin (LIPITOR) 10 MG tablet Take 10 mg by mouth at bedtime. 01/25/21   [provider]  Blood Glucose Monitoring Suppl (ACCU-CHEK GUIDE) w/Device KIT USE AS DIRECTED TO CHECK FASTING GLUCOSE DAILY AND 2 HOURS POSTPRANDIAL (AFTER A MEAL) 01/25/21   [provider]  dexmethylphenidate (FOCALIN XR) 15 MG 24 hr capsule Take 15 mg by mouth daily.    [provider]  DULoxetine (CYMBALTA) 60 MG capsule     [provider]  fluticasone-salmeterol (ADVAIR HFA) 115-21 MCG/ACT inhaler Inhale 2 puffs into the lungs 2 (two) times daily. 09/27/21   Freddi Starr, MD  gabapentin (NEURONTIN) 300 MG capsule Take 300 mg by mouth 3 (three) times daily as needed. 03/01/21   [provider]  hydrOXYzine (ATARAX/VISTARIL) 50 MG tablet Take 50 mg by mouth 4 (four) times daily. 02/07/21   [provider]  metFORMIN (GLUCOPHAGE) 500 MG tablet SMARTSIG:1 Tablet(s) By Mouth Morning-Evening 01/25/21   [provider]  naproxen (NAPROSYN) 500 MG tablet Take 500 mg by mouth as needed.    [provider]  omeprazole (PRILOSEC) 20 MG capsule Take 20 mg by mouth daily. 01/11/21   [provider]    Family History Family History  Problem Relation Age of Onset   Diabetes Paternal Grandmother    Heart disease Maternal Grandmother    Hearing loss Maternal Grandmother    Diabetes Maternal Grandfather    Hypertension Father    Diabetes Father    Cancer Mother 55       colon   Hearing loss Mother    Colon cancer Mother    Birth defects Maternal Uncle        hole in his heart at birth   Hearing loss Maternal Uncle     Diabetes Paternal Aunt    Diabetes Paternal Uncle    Esophageal cancer Neg Hx     Social History Social History   Tobacco Use   Smoking status: Some Days    Packs/day: 0.50    Years: 8.00    Total pack years: 4.00    Types: Cigarettes   Smokeless tobacco: Never  Vaping Use   Vaping Use: Never used  Substance Use Topics   Alcohol use: Yes  Comment: occasional   Drug use: Not Currently    Types: IV    Comment: quit 7 years ago     Allergies   Patient has no known allergies.   Review of Systems Review of Systems Per HPI  Physical Exam Triage Vital Signs ED Triage Vitals [03/30/22 1330]  Enc Vitals Group     BP 137/86     Pulse Rate 98     Resp 16     Temp 98 F (36.7 C)     Temp Source Oral     SpO2 95 %     Weight      Height      Head Circumference      Peak Flow      Pain Score 8     Pain Loc      Pain Edu?      Excl. in [place]?    No data found.  Updated Vital Signs BP 137/86 (BP Location: Right Arm)   Pulse 98   Temp 98 F (36.7 C) (Oral)   Resp 16   LMP  (Within Weeks) Comment: 2 weeks  SpO2 95%   Visual Acuity Right Eye Distance:   Left Eye Distance:   Bilateral Distance:    Right Eye Near:   Left Eye Near:    Bilateral Near:     Physical Exam Vitals and nursing note reviewed.  Constitutional:      General: She is not in acute distress.    Appearance: Normal appearance. She is not toxic-appearing.  HENT:     Head: Normocephalic and atraumatic.     Mouth/Throat:     Mouth: Mucous membranes are moist.     Pharynx: Oropharynx is clear.  Eyes:     General: No scleral icterus.    Extraocular Movements: Extraocular movements intact.  Cardiovascular:     Rate and Rhythm: Normal rate and regular rhythm.  Pulmonary:     Effort: Pulmonary effort is normal. No respiratory distress.     Breath sounds: Normal breath sounds. No wheezing, rhonchi or rales.  Abdominal:     General: Abdomen is flat. Bowel sounds are normal. There is no  distension.     Palpations: Abdomen is soft.     Tenderness: There is abdominal tenderness in the epigastric area. There is no right CVA tenderness, left CVA tenderness or guarding. Negative signs include Murphy's sign, Rovsing's sign and McBurney's sign.     Comments: No hernia appreciated on examination today  Musculoskeletal:     Cervical back: Normal range of motion.  Skin:    General: Skin is warm and dry.     Capillary Refill: Capillary refill takes less than 2 seconds.     Coloration: Skin is not jaundiced or pale.     Findings: No erythema.  Neurological:     Mental Status: She is alert and oriented to person, place, and time.  Psychiatric:        Mood and Affect: Mood is anxious.        Behavior: Behavior is cooperative.      UC Treatments / Results  Labs (all labs ordered are listed, but only abnormal results are displayed) Labs Reviewed  POCT URINALYSIS DIP (MANUAL ENTRY) - Abnormal; Notable for the following components:      Result Value   Clarity, UA cloudy (*)    Bilirubin, UA small (*)    Spec Grav, UA >=1.030 (*)    Protein Ur, POC =30 (*)  All other components within normal limits    EKG   Radiology No results found.  Procedures Procedures (including critical care time)  Medications Ordered in UC Medications - No data to display  Initial Impression / Assessment and Plan / UC Course  I have reviewed the triage vital signs and the nursing notes.  Pertinent labs & imaging results that were available during my care of the patient were reviewed by me and considered in my medical decision making (see chart for details).    Patient is a well-appearing 31 year old female presenting for abdominal pain.  In triage, she is normotensive, not tachycardic, not tachypneic, afebrile, and oxygenating well on room air.  Examination is unrevealing; discussed with patient that I do not appreciate an umbilical hernia today.  Urinalysis negative for signs of acute  infection.  Encouraged using Zofran 4 mg every hours as needed for nausea/vomiting.  Initially, I recommended restarting omeprazole, however patient declines this today.  Recommended follow-up with primary care provider and/or general surgeon if symptoms return.  ER precautions discussed.  The patient was given the opportunity to ask questions.  All questions answered to their satisfaction.  The patient is in agreement to this plan.   Final Clinical Impressions(s) / UC Diagnoses   Final diagnoses:  Epigastric pain  Nausea and vomiting, unspecified vomiting type     Discharge Instructions      - You can use Zofran under your tongue every 8 hours as needed for nausea/vomiting - Call General Surgeon's office to schedule an appointment (contact information is provided) - Go to ER if symptoms worsen     ED Prescriptions     Medication Sig Dispense Auth. Provider   ondansetron (ZOFRAN-ODT) 4 MG disintegrating tablet Take 1 tablet (4 mg total) by mouth every 8 (eight) hours as needed for nausea or vomiting. 20 tablet Eulogio Bear, NP      PDMP not reviewed this encounter.   Eulogio Bear, NP 03/30/22 [phone]

## 2022-04-20 ENCOUNTER — Other Ambulatory Visit: Payer: Self-pay | Admitting: *Deleted

## 2022-04-20 DIAGNOSIS — K429 Umbilical hernia without obstruction or gangrene: Secondary | ICD-10-CM

## 2022-04-26 ENCOUNTER — Ambulatory Visit: Payer: Medicaid Other | Admitting: Surgery

## 2022-04-26 ENCOUNTER — Encounter: Payer: Self-pay | Admitting: Surgery

## 2022-04-26 ENCOUNTER — Encounter (INDEPENDENT_AMBULATORY_CARE_PROVIDER_SITE_OTHER): Payer: Self-pay | Admitting: *Deleted

## 2022-04-26 VITALS — BP 109/72 | HR 78 | Temp 98.1°F | Resp 14 | Ht 65.0 in | Wt 238.0 lb

## 2022-04-26 DIAGNOSIS — R1033 Periumbilical pain: Secondary | ICD-10-CM

## 2022-04-26 MED ORDER — ONDANSETRON HCL 4 MG PO TABS: 4.0000 mg | ORAL_TABLET | Freq: Three times a day (TID) | ORAL | 0 refills | Status: AC | PRN

## 2022-04-26 NOTE — Progress Notes (Addendum)
[facility] History and Physical  Reason for Referral: Umbilical hernia Referring Physician: Referral from urgent care  Chief Complaint   New Patient (Initial Visit)     Tracy Berger is a 31 y.o. female.  HPI: Patient presents for evaluation of an umbilical hernia versus lower abdominal wall hernia.  When she was in her car at the end of August, she was turning while coughing and noticed a sharp pain in her lower abdomen.  She noted a bulge in the area, which comes and goes.  She will also occasionally have pain with urination when she notes the bulging.  She has chronic issues with nausea and vomiting, and feeling that food not settling in her stomach.  She denies any upper abdominal pain or right upper quadrant pain.  She does have chronic issues with GERD.  She does have obstipation issues, with her last bowel movement being today.  She has been passing flatus without difficulty.  Her past medical history significant for depression/anxiety, narcolepsy, diabetes, GERD, and back pain secondary to a pinched nerve.  Her surgical history significant for C-section x2.  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  She smokes a half a pack to 1 pack/day of cigarettes and occasionally drinks alcohol.  She does consume daily edible marijuana.  She denies use of blood thinning medications.  Past Medical History:  Diagnosis Date   Allergy    Anxiety    ASCUS of cervix with negative high risk HPV 03/20/2021   Pap showed ASCUS, negative HPV, other pap negative. 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/2014   Has 2 uterus and 2 cervix and septum in vagina   GERD (gastroesophageal reflux disease)    Gestational diabetes    with 1st pregnancy   HOH (hard of hearing) 01/05/2014   Supervision of other high-risk pregnancy(V23.89) 01/05/2014    Past Surgical  History:  Procedure Laterality Date   CESAREAN SECTION     CESAREAN SECTION N/A 07/20/2014   Procedure: CESAREAN SECTION;  Surgeon: Truett Mainland, DO;  Location: [facility];  Service: Obstetrics;  Laterality: N/A;   STAPEDECTOMY Left 2018   WISDOM TOOTH EXTRACTION      Family History  Problem Relation Age of Onset   Diabetes Paternal Grandmother    Heart disease Maternal Grandmother    Hearing loss Maternal Grandmother    Diabetes Maternal Grandfather    Hypertension Father    Diabetes Father    Cancer Mother 47       colon   Hearing loss Mother    Colon cancer Mother    Birth defects Maternal Uncle        hole in his heart at birth   Hearing loss Maternal Uncle    Diabetes Paternal Aunt    Diabetes Paternal Uncle    Esophageal cancer Neg Hx     Social History   Tobacco Use   Smoking status: Some Days    Packs/day: 0.50    Years: 8.00    Total pack years: 4.00    Types: Cigarettes   Smokeless tobacco: Never  Vaping Use   Vaping Use: Never used  Substance Use Topics   Alcohol use: Yes    Comment: occasional   Drug use: Not Currently    Types: IV    Comment: quit  7 years ago    Medications: I have reviewed the patient's current medications. Allergies as of 04/26/2022   No Known Allergies      Medication List        Accurate as of April 26, 2022 11:18 AM. If you have any questions, ask your nurse or doctor.          STOP taking these medications    ondansetron 4 MG disintegrating tablet Commonly known as: ZOFRAN-ODT Stopped by: Mindel Friscia A Adom Schoeneck, DO       TAKE these medications    Accu-Chek Guide test strip Generic drug: glucose blood USE AS DIRECTED TO CHECK FASTING GLUCOSE AND 2 HOURS AFTER EATING   Accu-Chek Guide w/Device Kit USE AS DIRECTED TO CHECK FASTING GLUCOSE DAILY AND 2 HOURS POSTPRANDIAL (AFTER A MEAL)   Accu-Chek Softclix Lancets lancets SMARTSIG:Topical   albuterol (2.5 MG/3ML) 0.083% nebulizer solution Commonly  known as: PROVENTIL Take 3 mLs (2.5 mg total) by nebulization every 6 (six) hours as needed for wheezing or shortness of breath.   albuterol 108 (90 Base) MCG/ACT inhaler Commonly known as: VENTOLIN HFA Inhale 2 puffs into the lungs every 6 (six) hours as needed for wheezing or shortness of breath.   albuterol 108 (90 Base) MCG/ACT inhaler Commonly known as: VENTOLIN HFA Inhale 1-2 puffs into the lungs every 6 (six) hours as needed for wheezing or shortness of breath. INHALE 1 PUFF BY MOUTH EVERY 6 HOURS AS NEEDED   Armodafinil 150 MG tablet Take 150 mg by mouth every morning.   atorvastatin 10 MG tablet Commonly known as: LIPITOR Take 10 mg by mouth at bedtime.   dexmethylphenidate 15 MG 24 hr capsule Commonly known as: FOCALIN XR Take 15 mg by mouth daily.   DULoxetine 60 MG capsule Commonly known as: CYMBALTA   fluticasone-salmeterol 115-21 MCG/ACT inhaler Commonly known as: ADVAIR HFA Inhale 2 puffs into the lungs 2 (two) times daily.   gabapentin 300 MG capsule Commonly known as: NEURONTIN Take 300 mg by mouth 3 (three) times daily as needed.   hydrOXYzine 50 MG tablet Commonly known as: ATARAX Take 50 mg by mouth 4 (four) times daily.   metFORMIN 500 MG tablet Commonly known as: GLUCOPHAGE SMARTSIG:1 Tablet(s) By Mouth Morning-Evening   naproxen 500 MG tablet Commonly known as: NAPROSYN Take 500 mg by mouth as needed.   omeprazole 20 MG capsule Commonly known as: PRILOSEC Take 20 mg by mouth daily.   ondansetron 4 MG tablet Commonly known as: Zofran Take 1 tablet (4 mg total) by mouth every 8 (eight) hours as needed for nausea or vomiting. Started by: Flint Melter Georgianna Band, DO         ROS:  Constitutional: negative for chills and fatigue Eyes: negative for visual disturbance and pain Ears, nose, mouth, throat, and face: negative for ear drainage, sore throat, and sinus problems Respiratory: positive for cough and wheezing, negative for shortness  of breath Cardiovascular: negative for chest pain and palpitations Gastrointestinal: positive for abdominal pain, nausea, reflux symptoms, and vomiting Genitourinary:positive for urinary retention, negative for dysuria and frequency Integument/breast: negative for dryness and rash Hematologic/lymphatic: negative for bleeding and lymphadenopathy Musculoskeletal:positive for back pain and joint pain Neurological: negative for dizziness, tremors, and numbness Endocrine: negative for temperature intolerance  Blood pressure 109/72, pulse 78, temperature 98.1 F (36.7 C), temperature source Oral, resp. rate 14, height '5\' 5"'  (1.651 m), weight 238 lb (108 kg), SpO2 96 %. Physical Exam Vitals reviewed.  Constitutional:  Appearance: Normal appearance.  HENT:     Head: Normocephalic and atraumatic.  Eyes:     Extraocular Movements: Extraocular movements intact.     Pupils: Pupils are equal, round, and reactive to light.  Cardiovascular:     Rate and Rhythm: Normal rate and regular rhythm.  Pulmonary:     Effort: Pulmonary effort is normal.     Breath sounds: Normal breath sounds.  Abdominal:     Comments: Abdomen soft, nondistended, no percussion tenderness, nontender to palpation; no rigidity, guarding, rebound tenderness; small, less than 1 cm in size umbilical hernia palpable (patient's symptomatic complaints were lower the umbilical hernia site), nontender to palpation; fullness in lower abdomen without discrete hernia defect palpable  Musculoskeletal:        General: Normal range of motion.     Cervical back: Normal range of motion.  Skin:    General: Skin is warm and dry.  Neurological:     General: No focal deficit present.     Mental Status: She is alert and oriented to person, place, and time.  Psychiatric:        Mood and Affect: Mood normal.        Behavior: Behavior normal.     Results: No results found for this or any previous visit (from the past 48 hour(s)).  No  results found.   Assessment & Plan:  Chasya Zenz is a 31 y.o. female who presents for evaluation of abdominal pain and fullness, and evaluation for hernia.  -I explained to the patient that I was unable to palpate any overt hernia defects excluding a small umbilical hernia which is not the location of the patient's current symptoms -We will order a CT abdomen and pelvis to further delineate if the patient has a ventral hernia -I explained that her nausea and vomiting symptoms could be related to reflux versus potential biliary pathology, though her symptoms are not classic for symptomatic cholelithiasis.  Since we are obtaining a CT scan, this will evaluate to see if she has any overt abnormal signs in her upper abdomen -We will also refer patient over to GI for evaluation of nausea, vomiting, and GERD symptoms -I also explained that it would be wise for the patient to follow-up with her GYN, as if the CT scan does not show any abnormalities, her symptoms may be related to her abnormal gynecologic history -I advised the patient to present to the emergency department if she begins to have nausea, vomiting, obstipation, tender and painful bulge in lower abdomen that is not reducible, or overlying skin changes -I will plan to call the patient once we obtain the CT scan to further discuss findings -Refill provided to the patient for her Zofran  All questions were answered to the satisfaction of the patient.   Graciella Freer, DO [facility] [address] [place],  [address] [phone] (office)

## 2022-04-30 ENCOUNTER — Encounter: Payer: Self-pay | Admitting: *Deleted

## 2022-06-15 ENCOUNTER — Ambulatory Visit (HOSPITAL_COMMUNITY): Payer: Medicaid Other

## 2022-07-09 ENCOUNTER — Ambulatory Visit (INDEPENDENT_AMBULATORY_CARE_PROVIDER_SITE_OTHER): Payer: Medicaid Other | Admitting: Gastroenterology

## 2022-10-01 ENCOUNTER — Emergency Department (HOSPITAL_COMMUNITY)
Admission: EM | Admit: 2022-10-01 | Discharge: 2022-10-01 | Disposition: A | Discharge status: Home / Self Care | Payer: Medicaid Other | Attending: Emergency Medicine | Admitting: Emergency Medicine

## 2022-10-01 ENCOUNTER — Emergency Department (HOSPITAL_COMMUNITY): Payer: Medicaid Other

## 2022-10-01 ENCOUNTER — Other Ambulatory Visit: Payer: Self-pay

## 2022-10-01 ENCOUNTER — Encounter (HOSPITAL_COMMUNITY): Payer: Self-pay

## 2022-10-01 DIAGNOSIS — R112 Nausea with vomiting, unspecified: Secondary | ICD-10-CM | POA: Insufficient documentation

## 2022-10-01 DIAGNOSIS — Z87891 Personal history of nicotine dependence: Secondary | ICD-10-CM | POA: Insufficient documentation

## 2022-10-01 DIAGNOSIS — R55 Syncope and collapse: Secondary | ICD-10-CM | POA: Diagnosis not present

## 2022-10-01 DIAGNOSIS — W268XXA Contact with other sharp object(s), not elsewhere classified, initial encounter: Secondary | ICD-10-CM | POA: Diagnosis not present

## 2022-10-01 DIAGNOSIS — S01111A Laceration without foreign body of right eyelid and periocular area, initial encounter: Secondary | ICD-10-CM | POA: Diagnosis not present

## 2022-10-01 DIAGNOSIS — R739 Hyperglycemia, unspecified: Secondary | ICD-10-CM | POA: Insufficient documentation

## 2022-10-01 DIAGNOSIS — R42 Dizziness and giddiness: Secondary | ICD-10-CM | POA: Diagnosis present

## 2022-10-01 DIAGNOSIS — R197 Diarrhea, unspecified: Secondary | ICD-10-CM | POA: Diagnosis not present

## 2022-10-01 DIAGNOSIS — R1032 Left lower quadrant pain: Secondary | ICD-10-CM | POA: Diagnosis not present

## 2022-10-01 LAB — URINALYSIS, ROUTINE W REFLEX MICROSCOPIC
Bilirubin Urine: NEGATIVE
Glucose, UA: NEGATIVE mg/dL
Hgb urine dipstick: NEGATIVE
Ketones, ur: NEGATIVE mg/dL
Nitrite: NEGATIVE
Protein, ur: NEGATIVE mg/dL
Specific Gravity, Urine: 1.026 (ref 1.005–1.030)
pH: 5 (ref 5.0–8.0)

## 2022-10-01 LAB — CBC
HCT: 39.1 % (ref 36.0–46.0)
Hemoglobin: 12.8 g/dL (ref 12.0–15.0)
MCH: 29.7 pg (ref 26.0–34.0)
MCHC: 32.7 g/dL (ref 30.0–36.0)
MCV: 90.7 fL (ref 80.0–100.0)
Platelets: 269 10*3/uL (ref 150–400)
RBC: 4.31 MIL/uL (ref 3.87–5.11)
RDW: 13.1 % (ref 11.5–15.5)
WBC: 8 10*3/uL (ref 4.0–10.5)
nRBC: 0 % (ref 0.0–0.2)

## 2022-10-01 LAB — BASIC METABOLIC PANEL
Anion gap: 7 (ref 5–15)
BUN: 17 mg/dL (ref 6–20)
CO2: 24 mmol/L (ref 22–32)
Calcium: 8.9 mg/dL (ref 8.9–10.3)
Chloride: 101 mmol/L (ref 98–111)
Creatinine, Ser: 0.65 mg/dL (ref 0.44–1.00)
GFR, Estimated: >60 mL/min (ref >60)
Glucose, Bld: 117 mg/dL — ABNORMAL HIGH (ref 70–99)
Potassium: 3.8 mmol/L (ref 3.5–5.1)
Sodium: 132 mmol/L — ABNORMAL LOW (ref 135–145)

## 2022-10-01 LAB — CBG MONITORING, ED: Glucose-Capillary: 117 mg/dL — ABNORMAL HIGH (ref 70–99)

## 2022-10-01 LAB — POC URINE PREG, ED: Preg Test, Ur: NEGATIVE

## 2022-10-01 MED ORDER — KETOROLAC TROMETHAMINE 60 MG/2ML IM SOLN
60.0000 mg | Freq: Once | INTRAMUSCULAR | Status: AC
  Administered 2022-10-01: 60 mg via INTRAMUSCULAR
  Filled 2022-10-01: qty 2

## 2022-10-01 MED ORDER — ONDANSETRON 4 MG PO TBDP: 4.0000 mg | ORAL_TABLET | Freq: Three times a day (TID) | ORAL | 0 refills | Status: DC | PRN

## 2022-10-01 MED ORDER — ONDANSETRON 8 MG PO TBDP
8.0000 mg | ORAL_TABLET | Freq: Once | ORAL | Status: AC
  Administered 2022-10-01: 8 mg via ORAL
  Filled 2022-10-01: qty 1

## 2022-10-01 NOTE — ED Notes (Addendum)
Rooms darkened

## 2022-10-01 NOTE — Discharge Instructions (Signed)
You were seen in the emergency department for nausea vomiting diarrhea and a syncopal event.  You had a CAT scan of your head along with blood work that did not show any significant findings.  Please rest and drink plenty of fluids.  Follow-up with your regular doctor.  We are prescribing you some nausea medication to use as needed.  Return to the emergency department if any worsening or concerning symptoms

## 2022-10-01 NOTE — ED Notes (Signed)
Pt resting quielty.  No changes in assessment.  States headache is getting better

## 2022-10-01 NOTE — ED Notes (Signed)
Patient transported to CT 

## 2022-10-01 NOTE — ED Notes (Signed)
Returned from CT.

## 2022-10-01 NOTE — ED Provider Notes (Signed)
[facility] Provider Note   CSN: [id number] Arrival date & time: 10/01/22  1828     History {Add pertinent medical, surgical, social history, OB history to HPI:1} No chief complaint on file.   Tracy Berger is a 32 y.o. female.  She said when she woke up this morning she felt very dizzy lightheaded and nauseous.  Had a little bit of diarrhea and wondered if she had a stomach bug.  She vomited at work so went home and while she was in the yard she began experiencing feeling very lightheaded and sweaty. she ultimately had a syncopal event and hit her head.  Complaining of headache and nausea.  States 1 prior syncopal event when she was 68.  Last menstrual period was a few weeks ago no concerns.  She has not tried anything for her headache.  The history is provided by the patient.  Loss of Consciousness Episode history:  Single Most recent episode:  Today Progression:  Partially resolved Chronicity:  New Context: normal activity   Witnessed: no   Relieved by:  Lying down Associated symptoms: diaphoresis, dizziness, headaches, nausea and vomiting   Associated symptoms: no chest pain and no shortness of breath        Home Medications Prior to Admission medications   Medication Sig Start Date End Date Taking? Authorizing Provider  ACCU-CHEK GUIDE test strip USE AS DIRECTED TO CHECK FASTING GLUCOSE AND 2 HOURS AFTER EATING 02/23/21   [provider]  Accu-Chek Softclix Lancets lancets SMARTSIG:Topical 01/25/21   [provider]  albuterol (PROVENTIL) (2.5 MG/3ML) 0.083% nebulizer solution Take 3 mLs (2.5 mg total) by nebulization every 6 (six) hours as needed for wheezing or shortness of breath. 09/27/21   Freddi Starr, MD  albuterol (VENTOLIN HFA) 108 (90 Base) MCG/ACT inhaler Inhale 2 puffs into the lungs every 6 (six) hours as needed for wheezing or shortness of breath. 10/02/21   Freddi Starr, MD  albuterol  (VENTOLIN HFA) 108 (90 Base) MCG/ACT inhaler Inhale 1-2 puffs into the lungs every 6 (six) hours as needed for wheezing or shortness of breath. INHALE 1 PUFF BY MOUTH EVERY 6 HOURS AS NEEDED 01/02/22   Freddi Starr, MD  Armodafinil 150 MG tablet Take 150 mg by mouth every morning. 03/02/21   [provider]  atorvastatin (LIPITOR) 10 MG tablet Take 10 mg by mouth at bedtime. 01/25/21   [provider]  Blood Glucose Monitoring Suppl (ACCU-CHEK GUIDE) w/Device KIT USE AS DIRECTED TO CHECK FASTING GLUCOSE DAILY AND 2 HOURS POSTPRANDIAL (AFTER A MEAL) 01/25/21   [provider]  dexmethylphenidate (FOCALIN XR) 15 MG 24 hr capsule Take 15 mg by mouth daily.    [provider]  DULoxetine (CYMBALTA) 60 MG capsule     [provider]  fluticasone-salmeterol (ADVAIR HFA) 115-21 MCG/ACT inhaler Inhale 2 puffs into the lungs 2 (two) times daily. 09/27/21   Freddi Starr, MD  gabapentin (NEURONTIN) 300 MG capsule Take 300 mg by mouth 3 (three) times daily as needed. 03/01/21   [provider]  hydrOXYzine (ATARAX/VISTARIL) 50 MG tablet Take 50 mg by mouth 4 (four) times daily. 02/07/21   [provider]  metFORMIN (GLUCOPHAGE) 500 MG tablet SMARTSIG:1 Tablet(s) By Mouth Morning-Evening 01/25/21   [provider]  naproxen (NAPROSYN) 500 MG tablet Take 500 mg by mouth as needed.    [provider]  omeprazole (PRILOSEC) 20 MG capsule Take 20 mg by  mouth daily. 01/11/21   [provider]  ondansetron (ZOFRAN) 4 MG tablet Take 1 tablet (4 mg total) by mouth every 8 (eight) hours as needed for nausea or vomiting. 04/26/22   Pappayliou, Barnetta Chapel A, DO      Allergies    Patient has no known allergies.    Review of Systems   Review of Systems  Constitutional:  Positive for diaphoresis.  Respiratory:  Negative for shortness of breath.   Cardiovascular:  Positive for syncope. Negative for chest pain.  Gastrointestinal:   Positive for nausea and vomiting.  Genitourinary:  Negative for dysuria.  Skin:  Positive for wound.  Neurological:  Positive for dizziness, syncope and headaches.    Physical Exam Updated Vital Signs BP 125/79   Pulse 97   Temp 100.1 F (37.8 C) (Oral)   Resp 18   Wt 92.1 kg   SpO2 100%   BMI 33.78 kg/m  Physical Exam Vitals and nursing note reviewed.  Constitutional:      General: She is not in acute distress.    Appearance: Normal appearance. She is well-developed.  HENT:     Head: Normocephalic.     Comments: she has a very superficial laceration below her right eyebrow. Eyes:     Conjunctiva/sclera: Conjunctivae normal.  Cardiovascular:     Rate and Rhythm: Normal rate and regular rhythm.     Heart sounds: No murmur heard. Pulmonary:     Effort: Pulmonary effort is normal. No respiratory distress.     Breath sounds: Normal breath sounds.  Abdominal:     Palpations: Abdomen is soft.     Tenderness: There is no abdominal tenderness. There is no guarding or rebound.  Musculoskeletal:        General: Tenderness present. No deformity.     Cervical back: Neck supple.     Comments: She has some tenderness in her left groin.  Hip is full range of motion without any limitations.  Skin:    General: Skin is warm and dry.     Capillary Refill: Capillary refill takes less than 2 seconds.  Neurological:     General: No focal deficit present.     Mental Status: She is alert and oriented to person, place, and time.     Cranial Nerves: No cranial nerve deficit.     Sensory: No sensory deficit.     Motor: No weakness.  Psychiatric:        Mood and Affect: Mood normal.     ED Results / Procedures / Treatments   Labs (all labs ordered are listed, but only abnormal results are displayed) Labs Reviewed  BASIC METABOLIC PANEL - Abnormal; Notable for the following components:      Result Value   Sodium 132 (*)    Glucose, Bld 117 (*)    All other components within normal  limits  CBG MONITORING, ED - Abnormal; Notable for the following components:   Glucose-Capillary 117 (*)    All other components within normal limits  CBC  URINALYSIS, ROUTINE W REFLEX MICROSCOPIC  POC URINE PREG, ED    EKG None  Radiology No results found.  Procedures Procedures  {Document cardiac monitor, telemetry assessment procedure when appropriate:1}  Medications Ordered in ED Medications  ondansetron (ZOFRAN-ODT) disintegrating tablet 8 mg (has no administration in time range)    ED Course/ Medical Decision Making/ A&P   {   Click here for ABCD2, HEART and other calculatorsREFRESH Note before signing :1}  Medical Decision Making Amount and/or Complexity of Data Reviewed Labs: ordered. Radiology: ordered.  Risk Prescription drug management.   This patient complains of ***; this involves an extensive number of treatment Options and is a complaint that carries with it a high risk of complications and morbidity. The differential includes ***  I ordered, reviewed and interpreted labs, which included *** I ordered medication *** and reviewed PMP when indicated. I ordered imaging studies which included *** and I independently    visualized and interpreted imaging which showed *** Additional history obtained from *** Previous records obtained and reviewed *** I consulted *** and discussed lab and imaging findings and discussed disposition.  Cardiac monitoring reviewed, *** Social determinants considered, *** Critical Interventions: ***  After the interventions stated above, I reevaluated the patient and found *** Admission and further testing considered, ***   {Document critical care time when appropriate:1} {Document review of labs and clinical decision tools ie heart score, Chads2Vasc2 etc:1}  {Document your independent review of radiology images, and any outside records:1} {Document your discussion with family members, caretakers,  and with consultants:1} {Document social determinants of health affecting pt's care:1} {Document your decision making why or why not admission, treatments were needed:1} Final Clinical Impression(s) / ED Diagnoses Final diagnoses:  None    Rx / DC Orders ED Discharge Orders     None

## 2022-10-01 NOTE — ED Triage Notes (Addendum)
Pt presents with nausea, dizziness and syncopal episode this morning. Pt hit head and has small cut to the right eyebrow. Pt states she has not felt well the rest of the day.

## 2023-07-16 ENCOUNTER — Other Ambulatory Visit: Payer: Self-pay | Admitting: *Deleted

## 2023-07-16 DIAGNOSIS — I8393 Asymptomatic varicose veins of bilateral lower extremities: Secondary | ICD-10-CM

## 2023-07-17 ENCOUNTER — Other Ambulatory Visit (INDEPENDENT_AMBULATORY_CARE_PROVIDER_SITE_OTHER): Payer: Medicaid Other

## 2023-07-17 ENCOUNTER — Other Ambulatory Visit (INDEPENDENT_AMBULATORY_CARE_PROVIDER_SITE_OTHER): Payer: Self-pay

## 2023-07-17 ENCOUNTER — Ambulatory Visit: Payer: Medicaid Other | Admitting: Orthopedic Surgery

## 2023-07-17 ENCOUNTER — Encounter: Payer: Self-pay | Admitting: Orthopedic Surgery

## 2023-07-17 DIAGNOSIS — M79602 Pain in left arm: Secondary | ICD-10-CM

## 2023-07-17 DIAGNOSIS — M79601 Pain in right arm: Secondary | ICD-10-CM | POA: Diagnosis not present

## 2023-07-17 DIAGNOSIS — M545 Low back pain, unspecified: Secondary | ICD-10-CM | POA: Diagnosis not present

## 2023-07-17 NOTE — Progress Notes (Signed)
Office Visit Note   Patient: Tracy Berger           Date of Birth: 1991/06/04           MRN: [id number] Visit Date: 07/17/2023 Requested by: Quita Skye, PA-C [id number] [place],  [place] [address] PCP: Quita Skye, PA-C  Subjective: Chief Complaint  Patient presents with   Other    Bilateral arm and low back pain    HPI: Tracy Berger is a 32 y.o. female who presents to the office reporting multiple orthopedic complaints.  She does report bilateral shoulder pain with her shoulders "popping out".  That has been going on for many years per patient report.  She did do childhood "fighting".  Not sure if that was organized or not.  She also reports some neck pain with radicular arm symptoms at times.  Also describes long history of low back pain.  She works at [employer] and takes about 20,000 steps a day.  She states that when she presses herself up from a seated position with her arms that it is painful and she does describe mechanical symptoms more on the right than the left..                ROS: All systems reviewed are negative as they relate to the chief complaint within the history of present illness.  Patient denies fevers or chills.  Assessment & Plan: Visit Diagnoses:  1. Bilateral arm pain   2. Low back pain, unspecified back pain laterality, unspecified chronicity, unspecified whether sciatica present     Plan: Impression is neck and shoulder pain with possible component of radiculopathy but more concerning would be the coarseness and grinding in the right shoulder compared to the left.  No real weakness.  She also has significant low back pain with radicular symptoms involving both legs but no definite weakness.  Radiographs of all areas today are unremarkable but based on the coarseness and grinding in that right shoulder my concern would be for small rotator cuff tear versus chondral defect.  MRI arthrogram indicated to evaluate for that for more than 1 year if  symptoms with worsening mechanical symptoms.  Regarding the low back MRI indicated for radiculopathy of long duration as well as interference with work activities.  Follow-up after the studies  Follow-Up Instructions: No follow-ups on file.   Orders:  Orders Placed This Encounter  Procedures   XR Cervical Spine 2 or 3 views   XR Shoulder Right   XR Shoulder Left   XR Lumbar Spine 2-3 Views   MR SHOULDER RIGHT W CONTRAST   Arthrogram   MR Lumbar Spine w/o contrast   No orders of the defined types were placed in this encounter.     Procedures: No procedures performed   Clinical Data: No additional findings.  Objective: Vital Signs: There were no vitals taken for this visit.  Physical Exam:  Constitutional: Patient appears well-developed HEENT:  Head: Normocephalic Eyes:EOM are normal Neck: Normal range of motion Cardiovascular: Normal rate Pulmonary/chest: Effort normal Neurologic: Patient is alert Skin: Skin is warm Psychiatric: Patient has normal mood and affect  Ortho Exam: Ortho exam demonstrates mildly positive nerve root tension signs on the right and left with no groin pain with internal/external rotation.  5 out of 5 ankle dorsiflexion plantarflexion quad hamstring strength.  Bilateral shoulders are examined.  Range passive range of motion is maintained in both shoulders to 70/95/165.  Rotator cuff strength is intact infraspinatus supraspinatus  and subscap muscle testing but the patient does have significant coarseness and grinding and crepitus in the right shoulder versus left.  O'Brien's testing equivocal on the right negative on the left.  No discrete AC joint tenderness is present.  Cervical spine range of motion is full.  Specialty Comments:  No specialty comments available.  Imaging: XR Cervical Spine 2 or 3 views Result Date: 07/17/2023 AP lateral radiographs cervical spine reviewed.  Loss of lordosis is present.  Very mild spurring present in the mid  cervical spine region.  No facet arthritis present.  XR Lumbar Spine 2-3 Views Result Date: 07/17/2023 AP lateral radiographs lumbar spine reviewed.  No acute fracture.  No spondylolisthesis.  Minimal degenerative disc disease and facet arthritis.  XR Shoulder Left Result Date: 07/17/2023 AP axillary outlet radiographs left  shoulder reviewed.  No acute fracture no glenohumeral joint or AC joint arthritis shoulder is located.  Acromiohumeral distance maintained.  Visualized lung fields intact.  XR Shoulder Right Result Date: 07/17/2023 AP axillary outlet radiographs right shoulder reviewed.  No acute fracture no glenohumeral joint or AC joint arthritis shoulder is located.  Acromiohumeral distance maintained.  Visualized lung fields intact.    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/2014   Has 2 uterus and 2 cervix and septum in vagina   GERD (gastroesophageal reflux disease)    Gestational diabetes    with 1st pregnancy   HOH (hard of hearing) 01/05/2014   Supervision of other high-risk pregnancy(V23.89) 01/05/2014    Family History   Problem Relation Age of Onset   Diabetes Paternal Grandmother    Heart disease Maternal Grandmother    Hearing loss Maternal Grandmother    Diabetes Maternal Grandfather    Hypertension Father    Diabetes Father    Cancer Mother 97       colon   Hearing loss Mother    Colon cancer Mother    Birth defects Maternal Uncle        hole in his heart at birth   Hearing loss Maternal Uncle    Diabetes Paternal Aunt    Diabetes Paternal Uncle    Esophageal cancer Neg Hx     Past Surgical History:  Procedure Laterality Date   CESAREAN SECTION     CESAREAN SECTION N/A 07/20/2014   Procedure: CESAREAN SECTION;  Surgeon: Levie Heritage, DO;  Location: [facility];  Service: Obstetrics;  Laterality: N/A;   STAPEDECTOMY Left 2018   WISDOM TOOTH EXTRACTION     Social History   Occupational History   Occupation: Homemaker  Tobacco Use  Smoking status: Some Days    Current packs/day: 0.50    Average packs/day: 0.5 packs/day for 8.0 years (4.0 ttl pk-yrs)    Types: Cigarettes   Smokeless tobacco: Never  Vaping Use   Vaping status: Never Used  Substance and Sexual Activity   Alcohol use: Yes    Comment: occasional   Drug use: Not Currently    Types: IV    Comment: quit 7 years ago   Sexual activity: Yes    Birth control/protection: Surgical    Comment: husband had vasectomy

## 2023-07-22 ENCOUNTER — Ambulatory Visit: Payer: Medicaid Other | Admitting: Physician Assistant

## 2023-07-22 ENCOUNTER — Ambulatory Visit (HOSPITAL_COMMUNITY)
Admission: RE | Admit: 2023-07-22 | Discharge: 2023-07-22 | Disposition: A | Discharge status: Home / Self Care | Payer: Medicaid Other | Source: Ambulatory Visit | Attending: Surgery | Admitting: Surgery

## 2023-07-22 VITALS — BP 116/74 | HR 74 | Temp 97.5°F | Resp 16 | Ht 65.0 in | Wt 177.5 lb

## 2023-07-22 DIAGNOSIS — I8393 Asymptomatic varicose veins of bilateral lower extremities: Secondary | ICD-10-CM | POA: Diagnosis present

## 2023-07-22 DIAGNOSIS — I872 Venous insufficiency (chronic) (peripheral): Secondary | ICD-10-CM | POA: Diagnosis not present

## 2023-07-22 NOTE — Progress Notes (Signed)
[facility] [place] [facility]   Reason for referral: Swollen left leg with varicose veins  History of Present Illness  Tracy Berger is a 32 y.o. female who presents with chief complaint: swollen leg.  Patient notes, onset of swelling years  ago, associated with standing and prolonged sitting.  The patient has had no history of DVT, positive history of varicose vein, no history of venous stasis ulcers, no history of  Lymphedema and no history of skin changes in lower legs.  There is a family history of venous disorders.  The patient has not used compression stockings in the past.  She states she has noticed increased swelling at the end of the day, and more areas of the left leg.  She denies non healing wounds , rest pain or claudication.  She states her grandmother had varicose veins.    Past Medical History:  Diagnosis Date   Allergy    Anxiety    ASCUS of cervix with negative high risk HPV 03/20/2021   Pap showed ASCUS, negative HPV, other pap negative. 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/2014   Has 2 uterus and 2 cervix and septum in vagina   GERD (gastroesophageal reflux disease)    Gestational diabetes    with 1st pregnancy   HOH (hard of hearing) 01/05/2014   Supervision of other high-risk pregnancy(V23.89) 01/05/2014    Past Surgical History:  Procedure Laterality Date   CESAREAN SECTION     CESAREAN SECTION N/A 07/20/2014   Procedure: CESAREAN SECTION;  Surgeon: Levie Heritage, DO;  Location: [facility];  Service: Obstetrics;  Laterality: N/A;   STAPEDECTOMY Left 2018   WISDOM TOOTH EXTRACTION      Social History   Socioeconomic History   Marital status: Married    Spouse name: Not on file   Number of children: 2   Years of education: Not on file   Highest education level: Not on file  Occupational History   Occupation: Homemaker  Tobacco Use   Smoking  status: Some Days    Current packs/day: 0.50    Average packs/day: 0.5 packs/day for 8.0 years (4.0 ttl pk-yrs)    Types: Cigarettes   Smokeless tobacco: Never  Vaping Use   Vaping status: Every Day  Substance and Sexual Activity   Alcohol use: Yes    Comment: occasional   Drug use: Not Currently    Types: IV    Comment: quit 7 years ago   Sexual activity: Yes    Birth control/protection: Surgical    Comment: husband had vasectomy  Other Topics Concern   Not on file  Social History Narrative   Not on file   Social Drivers of Health   Financial Resource Strain: Not on File (11/16/2021)   Received from [facility], [facility]    Financial Resource Strain: 0  Food Insecurity: Not at Risk (06/24/2023)   Received from [facility] Insecurity    Food: 1  Transportation Needs: Not at Risk (06/24/2023)   Received from [facility] Needs    Transportation: 1  Physical Activity: Not on File (11/16/2021)   Received from [place], [place]   Physical Activity    Physical Activity: 0  Stress: Not on File (11/16/2021)   Received from [place], [place]   Stress  Stress: 0  Social Connections: Not on File (04/25/2023)   Received from [facility]   Social Connections    Connectedness: 0  Intimate Partner Violence: Unknown (10/31/2021)   Received from [facility], [facility]   HITS    Physically Hurt: Not on file    Insult or Talk Down To: Not on file    Threaten Physical Harm: Not on file    Scream or Curse: Not on file    Family History  Problem Relation Age of Onset   Diabetes Paternal Grandmother    Heart disease Maternal Grandmother    Hearing loss Maternal Grandmother    Diabetes Maternal Grandfather    Hypertension Father    Diabetes Father    Cancer Mother 29       colon   Hearing loss Mother    Colon cancer Mother    Birth defects Maternal Uncle        hole in his heart at birth   Hearing loss Maternal Uncle    Diabetes Paternal Aunt     Diabetes Paternal Uncle    Esophageal cancer Neg Hx     Current Outpatient Medications on File Prior to Visit  Medication Sig Dispense Refill   naproxen (NAPROSYN) 500 MG tablet Take 500 mg by mouth as needed.     ondansetron (ZOFRAN) 4 MG tablet Take 1 tablet (4 mg total) by mouth every 8 (eight) hours as needed for nausea or vomiting. 20 tablet 0   ACCU-CHEK GUIDE test strip USE AS DIRECTED TO CHECK FASTING GLUCOSE AND 2 HOURS AFTER EATING     Accu-Chek Softclix Lancets lancets SMARTSIG:Topical     albuterol (PROVENTIL) (2.5 MG/3ML) 0.083% nebulizer solution Take 3 mLs (2.5 mg total) by nebulization every 6 (six) hours as needed for wheezing or shortness of breath. 75 mL 12   albuterol (VENTOLIN HFA) 108 (90 Base) MCG/ACT inhaler Inhale 2 puffs into the lungs every 6 (six) hours as needed for wheezing or shortness of breath. 8 g 6   albuterol (VENTOLIN HFA) 108 (90 Base) MCG/ACT inhaler Inhale 1-2 puffs into the lungs every 6 (six) hours as needed for wheezing or shortness of breath. INHALE 1 PUFF BY MOUTH EVERY 6 HOURS AS NEEDED 8 g 5   Armodafinil 150 MG tablet Take 150 mg by mouth every morning.     atorvastatin (LIPITOR) 10 MG tablet Take 10 mg by mouth at bedtime.     Blood Glucose Monitoring Suppl (ACCU-CHEK GUIDE) w/Device KIT USE AS DIRECTED TO CHECK FASTING GLUCOSE DAILY AND 2 HOURS POSTPRANDIAL (AFTER A MEAL)     dexmethylphenidate (FOCALIN XR) 15 MG 24 hr capsule Take 15 mg by mouth daily.     DULoxetine (CYMBALTA) 60 MG capsule      fluticasone-salmeterol (ADVAIR HFA) 115-21 MCG/ACT inhaler Inhale 2 puffs into the lungs 2 (two) times daily. 1 each 12   gabapentin (NEURONTIN) 300 MG capsule Take 300 mg by mouth 3 (three) times daily as needed.     hydrOXYzine (ATARAX/VISTARIL) 50 MG tablet Take 50 mg by mouth 4 (four) times daily.     metFORMIN (GLUCOPHAGE) 500 MG tablet SMARTSIG:1 Tablet(s) By Mouth Morning-Evening     omeprazole (PRILOSEC) 20 MG capsule Take 20 mg by mouth daily.      ondansetron (ZOFRAN-ODT) 4 MG disintegrating tablet Take 1 tablet (4 mg total) by mouth every 8 (eight) hours as needed for nausea or vomiting. 20 tablet 0   No current facility-administered medications on file prior  to visit.    Allergies as of 07/22/2023   (No Known Allergies)     ROS:   General:  No weight loss, Fever, chills  HEENT: No recent headaches, no nasal bleeding, no visual changes, no sore throat  Neurologic: No dizziness, blackouts, seizures. No recent symptoms of stroke or mini- stroke. No recent episodes of slurred speech, or temporary blindness.  Cardiac: No recent episodes of chest pain/pressure, no shortness of breath at rest.  No shortness of breath with exertion.  Denies history of atrial fibrillation or irregular heartbeat  Vascular: No history of rest pain in feet.  No history of claudication.  No history of non-healing ulcer, No history of DVT   Pulmonary: No home oxygen, no productive cough, no hemoptysis,  No asthma or wheezing  Musculoskeletal:  [ ]  Arthritis, [ ]  Low back pain,  [ ]  Joint pain  Hematologic:No history of hypercoagulable state.  No history of easy bleeding.  No history of anemia  Gastrointestinal: No hematochezia or melena,  No gastroesophageal reflux, no trouble swallowing  Urinary: [ ]  chronic Kidney disease, [ ]  on HD - [ ]  MWF or [ ]  TTHS, [ ]  Burning with urination, [ ]  Frequent urination, [ ]  Difficulty urinating;   Skin: No rashes  Psychological: positive history of anxiety,  positive history of depression  Physical Examination  Vitals:   07/22/23 1148  BP: 116/74  Pulse: 74  Resp: 16  Temp: (!) 97.5 F (36.4 C)  TempSrc: Temporal  SpO2: 98%  Weight: 177 lb 8 oz (80.5 kg)  Height: 5\' 5"  (1.651 m)    Body mass index is 29.54 kg/m.  General:  Alert and oriented, no acute distress HEENT: Normal Neck: No bruit or JVD Pulmonary: Clear to auscultation bilaterally Cardiac: Regular Rate and Rhythm without  murmur Abdomen: Soft, non-tender, non-distended, no mass, no scars Skin: No rash     Extremity Pulses:   radial, femoral, dorsalis pedis,  pulses bilaterally Musculoskeletal: No deformity, minimal edema  Neurologic: Upper and lower extremity motor 5/5 and symmetric  DATA: Venous Reflux Times  +--------------+---------+------+-----------+------------+--------+  RIGHT        Reflux NoRefluxReflux TimeDiameter cmsComments                          Yes                                   +--------------+---------+------+-----------+------------+--------+  CFV                    yes   >1 second                       +--------------+---------+------+-----------+------------+--------+  FV prox                 yes   >1 second                       +--------------+---------+------+-----------+------------+--------+  FV mid        no                                              +--------------+---------+------+-----------+------------+--------+  FV dist       no                                              +--------------+---------+------+-----------+------------+--------+  Popliteal    no                                              +--------------+---------+------+-----------+------------+--------+  GSV at [facility]              yes    >500 ms     0.732              +--------------+---------+------+-----------+------------+--------+  GSV prox thigh          yes    >500 ms     0.838              +--------------+---------+------+-----------+------------+--------+  GSV mid thigh           yes    >500 ms     0.573              +--------------+---------+------+-----------+------------+--------+  GSV dist thigh          yes    >500 ms     0.541              +--------------+---------+------+-----------+------------+--------+  GSV at knee             yes    >500 ms       1                 +--------------+---------+------+-----------+------------+--------+  GSV prox calf           yes    >500 ms     0.653              +--------------+---------+------+-----------+------------+--------+  SSV Pop Fossa no                           0.324              +--------------+---------+------+-----------+------------+--------+  SSV prox calf no                           0.249              +--------------+---------+------+-----------+------------+--------+  SSV mid calf  no                           0.213              +--------------+---------+------+-----------+------------+--------+     Summary:  Left:  - No evidence of deep vein thrombosis seen in the left lower extremity,  from the common femoral through the popliteal veins.    - Deep vein refluc in the CFV and FV.   - Superficial vein reflux in the SFJ and GSV.   Assessment/Plan: Left LE venous reflux noted on duplex in the deep system CFV and FV, reflux also noted in the SFJ, GSV top of the thigh to the calf.  The GSV is enlarged > 0.4 throughout it's course.  She has visible varicose veins on the medial and posterior calf.  No erythema, or brawny skin changes, no evidence of non healing wounds.  She does report heaviness and achy legs at the end of her work day.    I went over our vein handout and she will be fitted with thigh 20-30 mm hg compression, elevate her legs when at rest and exercise when possible, water therapy if available,  elevation of her feet when at rest.   She has palpable pedal pulses and is not at risk of limb loss.   She will f/u in 3-4 months for exam with out Vein MD to discuss possible laser ablation therapy for venous reflux.        Mosetta Pigeon PA-C [facility] [place] Office: [phone]  MD in clinic [place]

## 2023-07-25 ENCOUNTER — Encounter: Payer: Self-pay | Admitting: Orthopedic Surgery

## 2023-08-06 ENCOUNTER — Other Ambulatory Visit: Payer: Self-pay

## 2023-08-06 DIAGNOSIS — I872 Venous insufficiency (chronic) (peripheral): Secondary | ICD-10-CM

## 2023-08-27 ENCOUNTER — Encounter: Payer: Self-pay | Admitting: Orthopedic Surgery

## 2023-09-02 ENCOUNTER — Ambulatory Visit
Admission: RE | Admit: 2023-09-02 | Discharge: 2023-09-02 | Disposition: A | Discharge status: Home / Self Care | Payer: Medicaid Other | Source: Ambulatory Visit | Attending: Orthopedic Surgery | Admitting: Orthopedic Surgery

## 2023-09-02 DIAGNOSIS — M79601 Pain in right arm: Secondary | ICD-10-CM

## 2023-09-02 DIAGNOSIS — M545 Low back pain, unspecified: Secondary | ICD-10-CM

## 2023-09-02 MED ORDER — IOPAMIDOL (ISOVUE-M 200) INJECTION 41%
10.0000 mL | Freq: Once | INTRAMUSCULAR | Status: AC
  Administered 2023-09-02: 10 mL via INTRA_ARTICULAR

## 2023-09-10 NOTE — Progress Notes (Signed)
Hi Tracy Berger can you call her.  She needs follow-up on her back but I can call her with the results if she so desires.  She has not been seen in about 6 weeks thanks

## 2023-09-11 ENCOUNTER — Telehealth: Payer: Self-pay

## 2023-09-11 NOTE — Telephone Encounter (Signed)
-----   Message from Burnard Bunting sent at 09/10/2023  6:47 PM EST ----- Cresenciano Lick can you call her.  She needs follow-up on her back but I can call her with the results if she so desires.  She has not been seen in about 6 weeks thanks

## 2023-09-25 ENCOUNTER — Ambulatory Visit: Payer: Medicaid Other | Admitting: Surgical

## 2023-10-09 ENCOUNTER — Ambulatory Visit: Admitting: Surgical

## 2023-10-28 ENCOUNTER — Ambulatory Visit (HOSPITAL_COMMUNITY)
Admission: RE | Admit: 2023-10-28 | Discharge: 2023-10-28 | Disposition: A | Discharge status: Home / Self Care | Payer: Medicaid Other | Source: Ambulatory Visit | Attending: Surgery | Admitting: Surgery

## 2023-10-28 ENCOUNTER — Ambulatory Visit: Payer: Medicaid Other | Admitting: Surgery

## 2023-10-28 ENCOUNTER — Encounter: Payer: Self-pay | Admitting: Surgery

## 2023-10-28 VITALS — BP 102/68 | HR 68 | Temp 98.1°F | Ht 65.0 in | Wt 158.0 lb

## 2023-10-28 DIAGNOSIS — I872 Venous insufficiency (chronic) (peripheral): Secondary | ICD-10-CM | POA: Diagnosis not present

## 2023-10-28 DIAGNOSIS — I83892 Varicose veins of left lower extremities with other complications: Secondary | ICD-10-CM

## 2023-10-28 NOTE — Progress Notes (Signed)
 Vascular and Vein Specialist of [facility]  Patient name: Tracy Berger MRN: [id number] DOB: [birth year] Sex: female   REASON FOR VISIT:    Follow-up varicose vein  HISOTRY OF PRESENT ILLNESS:    Tracy Berger is a 33 y.o. female who was initially seen in the [facility] clinic for leg swelling.  She states this began many years ago.  It gets worse with standing or prolonged sitting.  She denies any history of DVT.  She has not had any venous stasis ulcers.  She has no history of lymphedema.  She does not have any history of skin discoloration in her legs.  There is a family history of venous pathology.  Her grandmother had varicose veins.  Her left leg is more symptomatic than the right.  She was fitted for 20-30 thigh-high compression stockings.  She is back for follow-up.  She did get some relief with compression stockings.  She walks approximately 20,000 steps.  She works for [employer]   PAST MEDICAL HISTORY:   Past Medical History:  Diagnosis Date   Allergy    Anxiety    ASCUS of cervix with negative high risk HPV 03/20/2021   Pap showed ASCUS, negative HPV, other pap negative. 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/2014   Has 2 uterus and 2 cervix and septum in vagina   GERD (gastroesophageal reflux disease)    Gestational diabetes    with 1st pregnancy   HOH (hard of hearing) 01/05/2014   Supervision of other high-risk pregnancy(V23.89) 01/05/2014     FAMILY HISTORY:   Family History  Problem Relation Age of Onset   Diabetes Paternal Grandmother    Heart disease Maternal Grandmother    Hearing loss Maternal Grandmother    Diabetes Maternal Grandfather    Hypertension Father    Diabetes Father    Cancer Mother 60       colon   Hearing loss Mother    Colon cancer Mother    Birth defects Maternal Uncle        hole in his heart at birth    Hearing loss Maternal Uncle    Diabetes Paternal Aunt    Diabetes Paternal Uncle    Esophageal cancer Neg Hx     SOCIAL HISTORY:   Social History   Tobacco Use   Smoking status: Some Days    Current packs/day: 0.50    Average packs/day: 0.5 packs/day for 8.0 years (4.0 ttl pk-yrs)    Types: Cigarettes   Smokeless tobacco: Never  Substance Use Topics   Alcohol use: Yes    Comment: occasional     ALLERGIES:   No Known Allergies   CURRENT MEDICATIONS:   Current Outpatient Medications  Medication Sig Dispense Refill   ACCU-CHEK GUIDE test strip USE AS DIRECTED TO CHECK FASTING GLUCOSE AND 2 HOURS AFTER EATING     Accu-Chek Softclix Lancets lancets SMARTSIG:Topical     albuterol (PROVENTIL) (2.5 MG/3ML) 0.083% nebulizer solution Take 3 mLs (2.5 mg total) by nebulization every 6 (six) hours as needed for wheezing or shortness of breath. 75 mL 12   albuterol (VENTOLIN HFA) 108 (90 Base) MCG/ACT inhaler Inhale 2 puffs into the lungs every 6 (six) hours as needed for wheezing or shortness of breath. 8 g 6   albuterol (VENTOLIN HFA) 108 (90 Base) MCG/ACT inhaler Inhale 1-2 puffs into  the lungs every 6 (six) hours as needed for wheezing or shortness of breath. INHALE 1 PUFF BY MOUTH EVERY 6 HOURS AS NEEDED 8 g 5   Armodafinil 150 MG tablet Take 150 mg by mouth every morning.     atorvastatin (LIPITOR) 10 MG tablet Take 10 mg by mouth at bedtime.     Blood Glucose Monitoring Suppl (ACCU-CHEK GUIDE) w/Device KIT USE AS DIRECTED TO CHECK FASTING GLUCOSE DAILY AND 2 HOURS POSTPRANDIAL (AFTER A MEAL)     dexmethylphenidate (FOCALIN XR) 15 MG 24 hr capsule Take 15 mg by mouth daily.     DULoxetine (CYMBALTA) 60 MG capsule      fluticasone-salmeterol (ADVAIR HFA) 115-21 MCG/ACT inhaler Inhale 2 puffs into the lungs 2 (two) times daily. 1 each 12   gabapentin (NEURONTIN) 300 MG capsule Take 300 mg by mouth 3 (three) times daily as needed.     hydrOXYzine (ATARAX/VISTARIL) 50 MG tablet Take 50  mg by mouth 4 (four) times daily.     metFORMIN (GLUCOPHAGE) 500 MG tablet SMARTSIG:1 Tablet(s) By Mouth Morning-Evening     naproxen (NAPROSYN) 500 MG tablet Take 500 mg by mouth as needed.     omeprazole (PRILOSEC) 20 MG capsule Take 20 mg by mouth daily.     ondansetron (ZOFRAN) 4 MG tablet Take 1 tablet (4 mg total) by mouth every 8 (eight) hours as needed for nausea or vomiting. 20 tablet 0   ondansetron (ZOFRAN-ODT) 4 MG disintegrating tablet Take 1 tablet (4 mg total) by mouth every 8 (eight) hours as needed for nausea or vomiting. 20 tablet 0   No current facility-administered medications for this visit.    REVIEW OF SYSTEMS:   [X]  denotes positive finding, [ ]  denotes negative finding Cardiac  Comments:  Chest pain or chest pressure:    Shortness of breath upon exertion:    Short of breath when lying flat:    Irregular heart rhythm:        Vascular    Pain in calf, thigh, or hip brought on by ambulation:    Pain in feet at night that wakes you up from your sleep:     Blood clot in your veins:    Leg swelling:  x       Pulmonary    Oxygen at home:    Productive cough:     Wheezing:         Neurologic    Sudden weakness in arms or legs:     Sudden numbness in arms or legs:     Sudden onset of difficulty speaking or slurred speech:    Temporary loss of vision in one eye:     Problems with dizziness:         Gastrointestinal    Blood in stool:     Vomited blood:         Genitourinary    Burning when urinating:     Blood in urine:        Psychiatric    Major depression:         Hematologic    Bleeding problems:    Problems with blood clotting too easily:        Skin    Rashes or ulcers:        Constitutional    Fever or chills:      PHYSICAL EXAM:   There were no vitals filed for this visit.  GENERAL: The patient is a well-nourished female, in no acute  distress. The vital signs are documented above. CARDIAC: There is a regular rate and rhythm.   VASCULAR: Most prominent varicosities on the posterior leg at the popliteal fossa.  She does have a circular shaped varicosity on the medial calf and some isolated varicosities on the right lateral side of the leg.  I evaluated the saphenous vein with SonoSite.  It is straight without significant tortuosity PULMONARY: Non-labored respirations MUSCULOSKELETAL: There are no major deformities or cyanosis. NEUROLOGIC: No focal weakness or paresthesias are detected. SKIN: See photos below PSYCHIATRIC: The patient has a normal affect.     STUDIES:   I have reviewed the following reflux studies: +--------------+---------+------+-----------+------------+--------+  LEFT      Reflux NoRefluxReflux TimeDiameter cmsComments                          Yes                                   +--------------+---------+------+-----------+------------+--------+  CFV                    yes   >1 second                       +--------------+---------+------+-----------+------------+--------+  FV prox                 yes   >1 second                       +--------------+---------+------+-----------+------------+--------+  FV mid        no                                              +--------------+---------+------+-----------+------------+--------+  FV dist       no                                              +--------------+---------+------+-----------+------------+--------+  Popliteal    no                                              +--------------+---------+------+-----------+------------+--------+  GSV at SFJ              yes    >500 ms     0.732              +--------------+---------+------+-----------+------------+--------+  GSV prox thigh          yes    >500 ms     0.838              +--------------+---------+------+-----------+------------+--------+  GSV mid thigh           yes    >500 ms     0.573               +--------------+---------+------+-----------+------------+--------+  GSV dist thigh          yes    >500 ms     0.541              +--------------+---------+------+-----------+------------+--------+  GSV at knee             yes    >500 ms       1                +--------------+---------+------+-----------+------------+--------+  GSV prox calf           yes    >500 ms     0.653              +--------------+---------+------+-----------+------------+--------+  SSV Pop Fossa no                           0.324              +--------------+---------+------+-----------+------------+--------+  SSV prox calf no                           0.249              +--------------+---------+------+-----------+------------+--------+  SSV mid calf  no                           0.213              +--------------+---------+------+-----------+------------+--   MEDICAL ISSUES:   CEAP class III disease, left leg: The patient has performed exercise, leg elevation, and compression therapy and still has persistent symptoms.  Ultrasound shows significant left great saphenous vein reflux with no significant deep vein reflux.  She has multiple prominent varicosities that are painful and itch.  The largest is in the popliteal fossa.  I discussed proceeding with left great saphenous vein laser ablation beginning around the knee as well as greater than 20 stabs to help with her symptoms.  We will work on Therapist, occupational and get her scheduled.    Charlena Cross, MD, FACS [facility] [phone] Pager [phone]

## 2023-12-31 ENCOUNTER — Other Ambulatory Visit: Payer: Self-pay | Admitting: *Deleted

## 2023-12-31 DIAGNOSIS — I83892 Varicose veins of left lower extremities with other complications: Secondary | ICD-10-CM

## 2024-03-18 ENCOUNTER — Other Ambulatory Visit: Payer: Self-pay | Admitting: *Deleted

## 2024-03-18 MED ORDER — LORAZEPAM 1 MG PO TABS: ORAL_TABLET | ORAL | 0 refills | Status: AC

## 2024-03-26 ENCOUNTER — Encounter: Payer: Self-pay | Admitting: Surgery

## 2024-03-26 ENCOUNTER — Ambulatory Visit: Attending: Surgery | Admitting: Surgery

## 2024-03-26 VITALS — BP 119/77 | HR 82 | Temp 97.8°F | Resp 18 | Ht 65.0 in | Wt 160.0 lb

## 2024-03-26 DIAGNOSIS — I83892 Varicose veins of left lower extremities with other complications: Secondary | ICD-10-CM | POA: Diagnosis not present

## 2024-03-26 HISTORY — PX: LASER ABLATION: SHX1947

## 2024-03-26 NOTE — Progress Notes (Signed)
     Laser Ablation Procedure    Date: 03/26/2024   Tracy Berger DOB:11/10/1990  Consent signed: Yes      Surgeon: Dr. Gaile New  Procedure: Laser Ablation: left Greater Saphenous Vein  Ht 5' 5 (1.651 m)   BMI 26.29 kg/m   Tumescent Anesthesia: 700 cc 0.9% NaCl with 50 cc Lidocaine HCL 1%  and 15 cc 8.4% NaHCO3  Local Anesthesia: 8 cc Lidocaine HCL and NaHCO3 (ratio 2:1)  7 watts continuous mode     Total energy: 1468.2 joules    Total time: 209 sec Treatment Length:30 cm  Laser Fiber Ref. # [id number]      Lot # [id number]   Stab Phlebectomy: greater than 20 Sites: Calf  Patient tolerated procedure well  Notes:  All staff members wore facial masks and facial shields/goggles.  Patient took Ativan  1mg  by mouth at 7:30 am and repeated  Ativan  1 mg by mouth at 8:27 am.  Description of Procedure: After marking the course of the secondary varicosities, the patient was placed on the operating table in the supine position, and the left leg was prepped and draped in sterile fashion.   Local anesthetic was administered and under ultrasound guidance the saphenous vein was accessed with a micro needle and guide wire; then the mirco puncture sheath was placed.  A guide wire was inserted saphenofemoral junction , followed by a 5 french sheath.  The position of the sheath and then the laser fiber below the junction was confirmed using the ultrasound.  Tumescent anesthesia was administered along the course of the saphenous vein using ultrasound guidance. The patient was placed in Trendelenburg position and protective laser glasses were placed on patient and staff, and the laser was fired at 7 watts continuous mode for a total of 1468.2 joules.  For stab phlebectomies, local anesthetic was administered at the previously marked varicosities, and tumescent anesthesia was administered around the vessels.  Greater than 20 stab wounds were made using the tip of an 11 blade. And using the vein  hook, the phlebectomies were performed using a hemostat to avulse the varicosities.  Adequate hemostasis was achieved.   Steri strips were applied to the stab wounds and ABD pads and thigh high compression stockings were applied.  Ace wrap bandages were applied over the phlebectomy sites and at the top of the saphenofemoral junction. Blood loss was less than 15 cc.  Discharge instructions reviewed with patient and hardcopy of discharge instructions given to patient to take home. The patient was taken out of the operating room via wheelchair to the lobby having tolerated the procedure well.

## 2024-04-01 ENCOUNTER — Other Ambulatory Visit: Payer: Self-pay

## 2024-04-09 ENCOUNTER — Ambulatory Visit (HOSPITAL_COMMUNITY): Admission: RE | Admit: 2024-04-09 | Source: Ambulatory Visit

## 2024-04-09 ENCOUNTER — Ambulatory Visit (HOSPITAL_COMMUNITY): Attending: Surgery | Admitting: Surgery

## 2024-04-09 ENCOUNTER — Telehealth: Payer: Self-pay | Admitting: *Deleted

## 2024-04-09 NOTE — Telephone Encounter (Signed)
 Called Tracy Berger about her missed appointments today for post LA Venous duplex (left leg) and 2 week s/p laser ablation VV FU with Dr. Serene . Tracy Berger states she had a death in the family and missed her appointment.  Rescheduled her for 04-23-2024 10:00 AM for venous duplex and 10:20 AM  for post LA VV FU with Dr. Serene.

## 2024-04-13 ENCOUNTER — Telehealth: Payer: Self-pay

## 2024-04-13 NOTE — Telephone Encounter (Signed)
 Pt called requesting that we fax her employer a return to work without restrictions note. This has been faxed to requested number. No further questions/concerns at this time.

## 2024-04-15 ENCOUNTER — Telehealth: Payer: Self-pay | Admitting: *Deleted

## 2024-04-15 NOTE — Telephone Encounter (Signed)
 Returning Tracy Berger's telephone message regarding left leg pain since starting back to work yesterday.  [name] is s/p endovenous laser ablation L GSV and stab phlebecomy >20 incisions left leg on 03-26-2024 by Gaile New MD. [name] missed her post op LA duplex and VV FU with Dr. New on 04/16/2024 due to death in her family and has been rescheduled for the post op appointments on 04-23-2024.  [name] states she is having achy, arthritic pain in her left leg while working. [name] states the pain is along her upper inner left thigh. Explained to Ms. Schaper that the laser ablation created an inflammation to close/seal off the vein and that results in aching pain.  Encouraged her to continue wearing thigh high compression hose, elevate her left leg when not working, and take Ibuprofen  as needed for leg pain.

## 2024-04-23 ENCOUNTER — Ambulatory Visit (INDEPENDENT_AMBULATORY_CARE_PROVIDER_SITE_OTHER): Admitting: Surgery

## 2024-04-23 ENCOUNTER — Ambulatory Visit (HOSPITAL_COMMUNITY)
Admission: RE | Admit: 2024-04-23 | Discharge: 2024-04-23 | Disposition: A | Discharge status: Home / Self Care | Source: Ambulatory Visit | Attending: Surgery | Admitting: Surgery

## 2024-04-23 ENCOUNTER — Encounter: Payer: Self-pay | Admitting: Surgery

## 2024-04-23 VITALS — BP 104/66 | HR 61 | Ht 65.0 in | Wt 176.0 lb

## 2024-04-23 DIAGNOSIS — I83892 Varicose veins of left lower extremities with other complications: Secondary | ICD-10-CM

## 2024-04-23 NOTE — Progress Notes (Signed)
     Subjective:     Patient ID: Tracy Berger, female   DOB: Mar 18, 1991, 33 y.o.   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  1 month ago she underwent left greater saphenous vein ablation with stab phlebectomy of greater than 20 sites.  She did not have significant reflux on the right for intervention.  She is now here today for follow-up with only complaints of minimal pain at her phlebectomy sites.   Review of Systems As above    Objective:   Physical Exam Vitals:   04/23/24 1020  BP: 104/66  Pulse: 61  SpO2: 96%      LEFT     Reflux NoRefluxReflux TimeDiameter cmsComments                     Yes                                   +---------+---------+------+-----------+------------+--------+  CFV                                           patent    +---------+---------+------+-----------+------------+--------+  FV mid                                         patent    +---------+---------+------+-----------+------------+--------+  Popliteal                                     patent    +---------+---------+------+-----------+------------+--------+         Summary:  Left:  - No evidence of deep vein thrombosis seen in the left lower extremity,  from the common femoral through the popliteal veins.  - Successful laser ablation of the left great saphenous vein from the knee  to within at least 3.3cm of the saphenofemoral junction.  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     Assessment:     33 year old female status post left greater saphenous vein ablation with stab phlebectomy for C3 venous disease.  No significant reflux on the right.  Now recovering well    Plan:     Satisfactory ablation by duplex and physical exam  Continue compression stockings as needed  Follow-up prn     Laila Myhre C. Sheree, MD [facility] Specialists of [place] Office: [phone] Pager: [phone]

## 2024-05-25 ENCOUNTER — Telehealth: Payer: Self-pay | Admitting: Diagnostic Neuroimaging

## 2024-05-25 NOTE — Telephone Encounter (Signed)
 Received sleep referral for pt from [facility], NP for daytime sleepiness. Placed in sleep referrals box

## 2024-06-01 ENCOUNTER — Encounter: Payer: Self-pay | Admitting: Radiology

## 2024-06-18 ENCOUNTER — Other Ambulatory Visit: Payer: Self-pay

## 2024-06-18 ENCOUNTER — Emergency Department (HOSPITAL_COMMUNITY)
Admission: EM | Admit: 2024-06-18 | Discharge: 2024-06-18 | Disposition: A | Discharge status: Home / Self Care | Source: Ambulatory Visit | Attending: Emergency Medicine | Admitting: Emergency Medicine

## 2024-06-18 ENCOUNTER — Encounter (HOSPITAL_COMMUNITY): Payer: Self-pay | Admitting: *Deleted

## 2024-06-18 ENCOUNTER — Emergency Department (HOSPITAL_COMMUNITY)

## 2024-06-18 ENCOUNTER — Telehealth: Payer: Self-pay | Admitting: *Deleted

## 2024-06-18 DIAGNOSIS — M79605 Pain in left leg: Secondary | ICD-10-CM | POA: Insufficient documentation

## 2024-06-18 NOTE — ED Triage Notes (Addendum)
 Pt states she had a vein ablation done on left leg in August. 2 days ago had pain in left ankle and notices small veins more visible in left leg. Pt called Vein specialist and was advised to come to ED for Ultrasound. Pt ambulatory to triage. States she stands a lot at work. Pt states only hurts when standing and leaning forward.

## 2024-06-18 NOTE — Discharge Instructions (Signed)
 Your ultrasound today is negative for DVT.  I do recommend elevation is much as is comfortable to help minimize swelling in the leg, also you may benefit from wearing compression stockings when you are going to be on your feet for long periods of time.

## 2024-06-18 NOTE — Telephone Encounter (Signed)
 Ms. Tracy Berger c/o left ankle pain and swelling for 2 days.  She states the left ankle is swollen, discolored, and painful.  Denies shortness of breath or leg swelling above ankle.  Patient has not been wearing compression hose or elevating left leg since onset of left leg pain and swelling.  Ms. Tracy Berger is s/p endovenous laser ablation and stab phlebectomy > 20 incisions (left calf) by Dr Serene on 03-26-2024.  No available lab slots at [facility] today. Offered venous reflux exam (left leg) tomorrow at 10:30 AM and appointment with Dr. Pearline at 11:00 AM.  Ms. Tracy Berger states she cannot come tomorrow because of work issues. Recommended that she go to [facility] or [facility] for venous (left leg) duplex study and evaluation of left ankle pain and swelling.  Ms. Tracy Berger is in agreement with plan.

## 2024-06-20 NOTE — ED Provider Notes (Signed)
 Berger Berger Provider Note   CSN: [id number] Arrival date & time: 06/18/24  1442     Patient presents with: Leg Pain   Berger Berger is a 33 y.o. female who underwent varicose vein ablation 3 months ago without complication until she noticed several days ago several small veins in her left ankle which are more prominent and have become tender but only when standing for long periods of time, patient works as a child psychotherapist.  She reached out to her vein specialist who advised she come here to rule out DVT.  She denies pain in her calf, unilateral leg swelling, also no shortness of breath or chest pain.  She has had no treatment for her symptoms prior to arrival.   The history is provided by the patient.       Prior to Admission medications   Medication Sig Start Date End Date Taking? Authorizing Provider  albuterol  (VENTOLIN  HFA) 108 (90 Base) MCG/ACT inhaler Inhale into the lungs. 01/06/21   [provider]  LORazepam  (ATIVAN ) 1 MG tablet Take 1 tablet 30 to 60 minutes prior to leaving house on day of office surgery.  Bring second tablet with you to office on day of office surgery. 03/18/24   Serene Gaile ORN, MD  NUVIGIL 250 MG tablet Take 250 mg by mouth every morning.    [provider]  ondansetron  (ZOFRAN ) 4 MG tablet Take 1 tablet (4 mg total) by mouth every 8 (eight) hours as needed for nausea or vomiting. 04/26/22   Pappayliou, Dorothyann A, DO    Allergies: Patient has no known allergies.    Review of Systems  Constitutional:  Negative for fever.  HENT:  Negative for congestion and sore throat.   Eyes: Negative.   Respiratory:  Negative for chest tightness and shortness of breath.   Cardiovascular:  Negative for chest pain, palpitations and leg swelling.  Gastrointestinal:  Negative for abdominal pain and nausea.  Genitourinary: Negative.   Musculoskeletal:  Negative for arthralgias, joint swelling and neck pain.   Skin: Negative.  Negative for rash and wound.  Neurological:  Negative for dizziness, weakness, light-headedness, numbness and headaches.  Psychiatric/Behavioral: Negative.      Updated Vital Signs BP 117/71   Pulse 66   Temp 98.5 F (36.9 C) (Oral)   Resp 16   Ht 5' 5 (1.651 m)   Wt 72.6 kg   LMP 06/11/2024 (Approximate)   SpO2 99%   BMI 26.63 kg/m   Physical Exam Vitals and nursing note reviewed.  Constitutional:      Appearance: She is well-developed.  HENT:     Head: Normocephalic and atraumatic.  Eyes:     Conjunctiva/sclera: Conjunctivae normal.  Cardiovascular:     Rate and Rhythm: Normal rate and regular rhythm.  Pulmonary:     Effort: Pulmonary effort is normal.  Musculoskeletal:        General: Normal range of motion.     Cervical back: Normal range of motion.     Right lower leg: No edema.     Left lower leg: No edema.  Skin:    General: Skin is warm and dry.     Capillary Refill: Capillary refill takes less than 2 seconds.     Findings: No bruising, erythema or wound.     Comments: Slightly prominent veins left medial ankle, nontender to palpation.  There is no calf tenderness, her legs are symmetric without unilateral swelling.  Negative Homans' sign.  Dorsalis pedal pulse intact.  Neurological:     Mental Status: She is alert.     (all labs ordered are listed, but only abnormal results are displayed) Labs Reviewed - No data to display  EKG: None  Radiology: No results found. Results for orders placed or performed during the hospital encounter of 10/01/22  POC CBG, ED   Collection Time: 10/01/22  7:12 PM  Result Value Ref Range   Glucose-Capillary 117 (H) 70 - 99 mg/dL  Basic metabolic panel   Collection Time: 10/01/22  7:32 PM  Result Value Ref Range   Sodium 132 (L) 135 - 145 mmol/L   Potassium 3.8 3.5 - 5.1 mmol/L   Chloride 101 98 - 111 mmol/L   CO2 24 22 - 32 mmol/L   Glucose, Bld 117 (H) 70 - 99 mg/dL   BUN 17 6 - 20 mg/dL    Creatinine, Ser 9.34 0.44 - 1.00 mg/dL   Calcium 8.9 8.9 - 89.6 mg/dL   GFR, Estimated >39 >39 mL/min   Anion gap 7 5 - 15  CBC   Collection Time: 10/01/22  7:32 PM  Result Value Ref Range   WBC 8.0 4.0 - 10.5 K/uL   RBC 4.31 3.87 - 5.11 MIL/uL   Hemoglobin 12.8 12.0 - 15.0 g/dL   HCT 60.8 63.9 - 53.9 %   MCV 90.7 80.0 - 100.0 fL   MCH 29.7 26.0 - 34.0 pg   MCHC 32.7 30.0 - 36.0 g/dL   RDW 86.8 88.4 - 84.4 %   Platelets 269 150 - 400 K/uL   nRBC 0.0 0.0 - 0.2 %  POC urine preg, ED   Collection Time: 10/01/22  9:40 PM  Result Value Ref Range   Preg Test, Ur Negative Negative  Urinalysis, Routine w reflex microscopic -Urine, Clean Catch   Collection Time: 10/01/22  9:44 PM  Result Value Ref Range   Color, Urine YELLOW YELLOW   APPearance HAZY (A) CLEAR   Specific Gravity, Urine 1.026 1.005 - 1.030   pH 5.0 5.0 - 8.0   Glucose, UA NEGATIVE NEGATIVE mg/dL   Hgb urine dipstick NEGATIVE NEGATIVE   Bilirubin Urine NEGATIVE NEGATIVE   Ketones, ur NEGATIVE NEGATIVE mg/dL   Protein, ur NEGATIVE NEGATIVE mg/dL   Nitrite NEGATIVE NEGATIVE   Leukocytes,Ua MODERATE (A) NEGATIVE   RBC / HPF 0-5 0 - 5 RBC/hpf   WBC, UA 11-20 0 - 5 WBC/hpf   Bacteria, UA RARE (A) NONE SEEN   Squamous Epithelial / HPF 0-5 0 - 5 /HPF   Mucus PRESENT    Ca Oxalate Crys, UA PRESENT    US  Venous Img Lower Unilateral Left Result Date: 06/18/2024 CLINICAL DATA:  Left lower extremity pain. Dependent edema, concern for DVT. History of left great saphenous vein laser ablation. EXAM: LEFT LOWER EXTREMITY VENOUS DOPPLER ULTRASOUND TECHNIQUE: Gray-scale sonography with graded compression, as well as color Doppler and duplex ultrasound were performed to evaluate the lower extremity deep venous systems from the level of the common femoral vein and including the common femoral, femoral, profunda femoral, popliteal and calf veins including the posterior tibial, peroneal and gastrocnemius veins when visible. Spectral  Doppler was utilized to evaluate flow at rest and with distal augmentation maneuvers in the common femoral, femoral and popliteal veins. COMPARISON:  None Available. FINDINGS: Contralateral Common Femoral Vein: Respiratory phasicity is normal and symmetric with the symptomatic side. No evidence of thrombus. Normal compressibility. Common Femoral Vein: No evidence of thrombus. Normal compressibility, respiratory  phasicity and response to augmentation. Saphenofemoral Junction: No evidence of thrombus. Normal compressibility and flow on color Doppler imaging. Profunda Femoral Vein: No evidence of thrombus. Normal compressibility and flow on color Doppler imaging. Femoral Vein: No evidence of thrombus. Normal compressibility, respiratory phasicity and response to augmentation. Duplicated femoral veins. Popliteal Vein: No evidence of thrombus. Normal compressibility, respiratory phasicity and response to augmentation. Calf Veins: No evidence of thrombus. Normal compressibility and flow on color Doppler imaging. Other Findings: Noncompressible superficial veins in the left calf could be associated with the left great saphenous vein and most likely related to previous great saphenous vein ablation. IMPRESSION: 1. Negative for deep venous thrombosis in left lower extremity. 2. Thrombosed superficial veins in left calf most likely associated with previous ablation procedure. Electronically Signed   By: Juliene Balder M.D.   On: 06/18/2024 16:58     Procedures   Medications Ordered in the ED - No data to display                                  Medical Decision Making Patient presenting with mildly tender prominent veins in her left medial ankle, there are minimal on today's exam, although she endorses they are more prominent when she is standing for long periods as she works as a child psychotherapist.  Concern for DVT.  Patient had an ablation 3 months ago.  Ultrasound was completed, patient has no DVT.  She does have  noncompressibility of superficial veins which would be consistent with her ablation procedure.  She was advised elevation is much as is comfortable and she may benefit from compression stockings when she is working or on her feet for long periods of time.  Reassurance was given regarding results of today's ultrasound.  Amount and/or Complexity of Data Reviewed Radiology: ordered.    Details: Ultrasound negative except for noncompressibility of superficial veins which would be consistent with her recent ablation procedure.        Final diagnoses:  Left leg pain    ED Discharge Orders     None          Kasir Hallenbeck, PA-C 06/20/24 2155    Patsey Lot, MD 06/25/24 [phone]

## 2024-07-09 ENCOUNTER — Encounter: Admitting: Women's Health

## 2024-07-29 ENCOUNTER — Institutional Professional Consult (permissible substitution): Admitting: Neurology

## 2024-08-25 ENCOUNTER — Encounter: Admitting: Women's Health

## 2024-08-27 ENCOUNTER — Institutional Professional Consult (permissible substitution): Admitting: Neurology

## 2024-08-27 ENCOUNTER — Encounter: Payer: Self-pay | Admitting: Neurology
